# Patient Record
Sex: Female | Born: 1935 | Race: White | Hispanic: No | Marital: Married | Smoking: Never smoker
Health system: Southern US, Community
[De-identification: ages and names within clinical notes are randomized; demographics above are authoritative.]

## PROBLEM LIST (undated history)

## (undated) DIAGNOSIS — I2510 Atherosclerotic heart disease of native coronary artery without angina pectoris: Secondary | ICD-10-CM

## (undated) DIAGNOSIS — I82409 Acute embolism and thrombosis of unspecified deep veins of unspecified lower extremity: Secondary | ICD-10-CM

## (undated) DIAGNOSIS — E7800 Pure hypercholesterolemia, unspecified: Secondary | ICD-10-CM

## (undated) DIAGNOSIS — C50919 Malignant neoplasm of unspecified site of unspecified female breast: Secondary | ICD-10-CM

## (undated) DIAGNOSIS — M1990 Unspecified osteoarthritis, unspecified site: Secondary | ICD-10-CM

## (undated) DIAGNOSIS — C55 Malignant neoplasm of uterus, part unspecified: Secondary | ICD-10-CM

## (undated) DIAGNOSIS — I10 Essential (primary) hypertension: Secondary | ICD-10-CM

## (undated) DIAGNOSIS — M3500 Sicca syndrome, unspecified: Secondary | ICD-10-CM

## (undated) DIAGNOSIS — R002 Palpitations: Secondary | ICD-10-CM

## (undated) DIAGNOSIS — E669 Obesity, unspecified: Secondary | ICD-10-CM

## (undated) DIAGNOSIS — E119 Type 2 diabetes mellitus without complications: Secondary | ICD-10-CM

## (undated) DIAGNOSIS — I4891 Unspecified atrial fibrillation: Secondary | ICD-10-CM

## (undated) HISTORY — PX: MASTECTOMY, RADICAL: SHX710

## (undated) HISTORY — DX: Acute embolism and thrombosis of unspecified deep veins of unspecified lower extremity: I82.409

## (undated) HISTORY — PX: TOTAL HIP ARTHROPLASTY: SHX124

## (undated) HISTORY — DX: Malignant neoplasm of unspecified site of unspecified female breast: C50.919

## (undated) HISTORY — PX: CARDIAC SURGERY: SHX584

## (undated) HISTORY — PX: ABDOMINAL HYSTERECTOMY: SHX81

## (undated) HISTORY — DX: Unspecified atrial fibrillation: I48.91

## (undated) HISTORY — PX: APPENDECTOMY: SHX54

---

## 2005-07-12 HISTORY — PX: TOTAL HIP ARTHROPLASTY: SHX124

## 2008-12-23 ENCOUNTER — Ambulatory Visit: Payer: Self-pay | Admitting: Internal Medicine

## 2010-06-16 ENCOUNTER — Ambulatory Visit: Payer: Self-pay | Admitting: Family Medicine

## 2010-07-12 ENCOUNTER — Ambulatory Visit: Payer: Self-pay | Admitting: Family Medicine

## 2010-08-12 ENCOUNTER — Ambulatory Visit: Payer: Self-pay | Admitting: Family Medicine

## 2015-11-25 ENCOUNTER — Encounter: Payer: Self-pay | Admitting: Emergency Medicine

## 2015-11-25 ENCOUNTER — Ambulatory Visit
Admission: EM | Admit: 2015-11-25 | Discharge: 2015-11-25 | Disposition: A | Discharge status: Home / Self Care | Payer: Medicare Other | Attending: Family Medicine | Admitting: Family Medicine

## 2015-11-25 DIAGNOSIS — H9192 Unspecified hearing loss, left ear: Secondary | ICD-10-CM

## 2015-11-25 HISTORY — DX: Type 2 diabetes mellitus without complications: E11.9

## 2015-11-25 HISTORY — DX: Malignant neoplasm of uterus, part unspecified: C55

## 2015-11-25 HISTORY — DX: Palpitations: R00.2

## 2015-11-25 HISTORY — DX: Unspecified osteoarthritis, unspecified site: M19.90

## 2015-11-25 HISTORY — DX: Pure hypercholesterolemia, unspecified: E78.00

## 2015-11-25 HISTORY — DX: Atherosclerotic heart disease of native coronary artery without angina pectoris: I25.10

## 2015-11-25 HISTORY — DX: Sjogren syndrome, unspecified: M35.00

## 2015-11-25 HISTORY — DX: Obesity, unspecified: E66.9

## 2015-11-25 HISTORY — DX: Essential (primary) hypertension: I10

## 2015-11-25 NOTE — ED Provider Notes (Signed)
CSN: [id number]     Arrival date & time 11/25/15  1336 History   First MD Initiated Contact with Patient 11/25/15 1528     Chief Complaint  Patient presents with  . Otalgia   (Consider location/radiation/quality/duration/timing/severity/associated sxs/prior Treatment) HPI  82 year old female who presents with the sudden onset of hearing loss and a in the left ear. She states that around 11:30 this morning it happened suddenly. The outside of her ear and it seemed to pop a few times in her hearing came back but then was eventually lost again. Noticed some swelling around her ear and her jaw. She's not had any altitude episodes either an airplane [place]. He states has noticed no foreign objects.      Past Medical History  Diagnosis Date  . Hypertension   . Diabetes mellitus without complication ([place])   . Coronary artery disease   . Obesity   . Hypercholesteremia   . Arthritis   . Palpitation   . Uterine cancer ([place])   . Sicca syndrome [facility])    Past Surgical History  Procedure Laterality Date  . Abdominal hysterectomy    . Appendectomy    . Cardiac surgery     No family history on file. Social History  Substance Use Topics  . Smoking status: Not on file  . Smokeless tobacco: Not on file  . Alcohol Use: Not on file   OB History    No data available     Review of Systems  Constitutional: Negative for fever, chills, activity change and fatigue.  HENT: Positive for ear pain and hearing loss.   All other systems reviewed and are negative.   Allergies  Acetaminophen; Amlodipine; Diltiazem; Lisinopril; Penicillins; Sulfa antibiotics; and Hydralazine  Home Medications   Prior to Admission medications   Medication Sig Start Date End Date Taking? Authorizing Provider  aspirin 81 MG tablet Take 81 mg by mouth daily.   Yes Historical Provider, MD  atorvastatin (LIPITOR) 40 MG tablet Take 40 mg by mouth daily.   Yes Historical Provider, MD  carvedilol  (COREG) 25 MG tablet Take 25 mg by mouth 2 (two) times daily with a meal.   Yes Historical Provider, MD  Cholecalciferol (VITAMIN D-3) 1000 units CAPS Take 1 capsule by mouth daily.   Yes Historical Provider, MD  docusate sodium (COLACE) 100 MG capsule Take 100 mg by mouth 2 (two) times daily.   Yes Historical Provider, MD  furosemide (LASIX) 20 MG tablet Take 20 mg by mouth daily.   Yes Historical Provider, MD  insulin glargine (LANTUS) 100 UNIT/ML injection Inject 48 Units into the skin at bedtime.   Yes Historical Provider, MD  insulin lispro (HUMALOG) 100 UNIT/ML injection Inject 15 Units into the skin 3 (three) times daily with meals.   Yes Historical Provider, MD  losartan (COZAAR) 100 MG tablet Take 100 mg by mouth daily.   Yes Historical Provider, MD  meloxicam (MOBIC) 7.5 MG tablet Take 7.5 mg by mouth daily.   Yes Historical Provider, MD  Multiple Vitamin (MULTIVITAMIN) tablet Take 1 tablet by mouth daily.   Yes Historical Provider, MD  nitroGLYCERIN (NITROSTAT) 0.4 MG SL tablet Place 0.4 mg under the tongue every 5 (five) minutes as needed for chest pain.   Yes Historical Provider, MD  nystatin (NYSTATIN) powder Apply topically 2 (two) times daily.   Yes Historical Provider, MD  polyethylene glycol (MIRALAX / GLYCOLAX) packet Take 17 g by mouth daily as needed.  Yes Historical Provider, MD  spironolactone (ALDACTONE) 25 MG tablet Take 25 mg by mouth daily.   Yes Historical Provider, MD  traMADol (ULTRAM) 50 MG tablet Take 50 mg by mouth every 6 (six) hours as needed.   Yes Historical Provider, MD  vitamin B-12 (CYANOCOBALAMIN) 1000 MCG tablet Take 1,000 mcg by mouth daily.   Yes Historical Provider, MD   Meds Ordered and Administered this Visit  Medications - No data to display  BP 190/76 mmHg  Pulse 60  Temp(Src) 98 F (36.7 C) (Oral)  Resp 16  Ht 5\' 2"  (1.575 m)  Wt 228 lb (103.42 kg)  BMI 41.69 kg/m2  SpO2 99% No data found.   Physical Exam  Constitutional: She appears  well-developed and well-nourished. No distress.  HENT:  Head: Normocephalic and atraumatic.  Right Ear: External ear normal.  Nose: Nose normal.  Mouth/Throat: Oropharynx is clear and moist.  Examination of left ear shows a large smooth poorly object at the depth of the ear. There are no anatomical landmarks noticed. There is no tenderness to traction of the tragus or article. No discharge is seen the canal is not swollen.  Skin: She is not diaphoretic.  Nursing note and vitals reviewed.   ED Course  Procedures (including critical care time)  Labs Review Labs Reviewed - No data to display  Imaging Review No results found.   Visual Acuity Review  Right Eye Distance:   Left Eye Distance:   Bilateral Distance:    Right Eye Near:   Left Eye Near:    Bilateral Near:         MDM   1. Hearing loss, left   Dr Alveta Heimlich examined the patient as well. We have recommended the patient be seen by ENT for further evaluation and treatment. Arrangements were made with [facility] for the patient to be seen tomorrow however she is unable to keep the appointment because she has noted a commitment. I left a message on answering machine to contact her so they can arrange appointment more suitable to her schedule.    Lorin Picket, PA-C 11/25/15 1640

## 2015-11-25 NOTE — Discharge Instructions (Signed)
Hearing Loss °Hearing loss is a partial or total loss of the ability to hear. This can be temporary or permanent, and it can happen in one or both ears. Hearing loss may be referred to as deafness. °Medical care is necessary to treat hearing loss properly and to prevent the condition from getting worse. Your hearing may partially or completely come back, depending on what caused your hearing loss and how severe it is. In some cases, hearing loss is permanent. °CAUSES °Common causes of hearing loss include:  °· Too much wax in the ear canal.   °· Infection of the ear canal or middle ear.   °· Fluid in the middle ear.   °· Injury to the ear or surrounding area.   °· An object stuck in the ear.   °· Prolonged exposure to loud sounds, such as music.   °Less common causes of hearing loss include:  °· Tumors in the ear.   °· Viral or bacterial infections, such as meningitis.   °· A hole in the eardrum (perforated eardrum). °· Problems with the hearing nerve that sends signals between the brain and the ear. °· Certain medicines.   °SYMPTOMS  °Symptoms of this condition may include: °· Difficulty telling the difference between sounds. °· Difficulty following a conversation when there is background noise. °· Lack of response to sounds in your environment. This may be most noticeable when you do not respond to startling sounds. °· Needing to turn up the volume on the television, radio, etc. °· Ringing in the ears. °· Dizziness. °· Pain in the ears. °DIAGNOSIS °This condition is diagnosed based on a physical exam and a hearing test (audiometry). The audiometry test will be performed by a hearing specialist (audiologist). You may also be referred to an ear, nose, and throat (ENT) specialist (otolaryngologist).  °TREATMENT °Treatment for recent onset of hearing loss may include:  °· Ear wax removal.   °· Being prescribed medicines to prevent infection (antibiotics).   °· Being prescribed medicines to reduce inflammation  (corticosteroids).   °HOME CARE INSTRUCTIONS °· If you were prescribed an antibiotic medicine, take it as told by your health care provider. Do not stop taking the antibiotic even if you start to feel better. °· Take over-the-counter and prescription medicines only as told by your health care provider. °· Avoid loud noises.   °· Return to your normal activities as told by your health care provider. Ask your health care provider what activities are safe for you. °· Keep all follow-up visits as told by your health care provider. This is important. °SEEK MEDICAL CARE IF:  °· You feel dizzy.   °· You develop new symptoms.   °· You vomit or feel nauseous.   °· You have a fever.   °SEEK IMMEDIATE MEDICAL CARE IF: °· You develop sudden changes in your vision.   °· You have severe ear pain.   °· You have new or increased weakness. °· You have a severe headache. °  °This information is not intended to replace advice given to you by your health care provider. Make sure you discuss any questions you have with your health care provider. °  °Document Released: 06/28/2005 Document Revised: 03/19/2015 Document Reviewed: 11/13/2014 °Elsevier Interactive Patient Education ©2016 [employer] ° °

## 2015-11-25 NOTE — ED Notes (Signed)
Patient c/o left ear pain and swelling x today.

## 2016-03-18 ENCOUNTER — Encounter: Payer: Self-pay | Admitting: Emergency Medicine

## 2016-03-18 ENCOUNTER — Emergency Department
Admission: EM | Admit: 2016-03-18 | Discharge: 2016-03-19 | Disposition: A | Discharge status: Home / Self Care | Payer: Medicare Other | Attending: Emergency Medicine | Admitting: Emergency Medicine

## 2016-03-18 DIAGNOSIS — E11649 Type 2 diabetes mellitus with hypoglycemia without coma: Secondary | ICD-10-CM | POA: Insufficient documentation

## 2016-03-18 DIAGNOSIS — I2510 Atherosclerotic heart disease of native coronary artery without angina pectoris: Secondary | ICD-10-CM | POA: Diagnosis not present

## 2016-03-18 DIAGNOSIS — Z7982 Long term (current) use of aspirin: Secondary | ICD-10-CM | POA: Diagnosis not present

## 2016-03-18 DIAGNOSIS — T50901A Poisoning by unspecified drugs, medicaments and biological substances, accidental (unintentional), initial encounter: Secondary | ICD-10-CM

## 2016-03-18 DIAGNOSIS — E162 Hypoglycemia, unspecified: Secondary | ICD-10-CM

## 2016-03-18 DIAGNOSIS — I10 Essential (primary) hypertension: Secondary | ICD-10-CM | POA: Diagnosis not present

## 2016-03-18 DIAGNOSIS — Z794 Long term (current) use of insulin: Secondary | ICD-10-CM | POA: Insufficient documentation

## 2016-03-18 DIAGNOSIS — T383X1A Poisoning by insulin and oral hypoglycemic [antidiabetic] drugs, accidental (unintentional), initial encounter: Secondary | ICD-10-CM | POA: Insufficient documentation

## 2016-03-18 LAB — GLUCOSE, CAPILLARY: Glucose-Capillary: 79 mg/dL (ref 65–99)

## 2016-03-18 NOTE — ED Provider Notes (Signed)
[facility] Emergency Department Provider Note   ____________________________________________   First MD Initiated Contact with Patient 03/18/16 2334     (approximate)  I have reviewed the triage vital signs and the nursing notes.   HISTORY  Chief Complaint Hypoglycemia   HPI Molly Mercado is a 82 y.o. female who presents to the ED from home via EMS with a chief complaint of accidental overdose. Patient is insulin-dependent diabetic and accidentally took 42 units of Humalog (instead of Lantus) at approximately 10:30 PM. She is asymptomatic but was afraid that her sugars would drop. States she has had multiple stressors recently which has made her tired; husband is hospitalized and scheduled for cardiac surgery next week. 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 Patient reports she just mixed up her insulins; denies active harm or intentional overdose. Patient drank orange choose prior to EMS arrival. Her blood sugars were in the 170s and 180s en route.Denies recent fever, chills, chest pain, shortness of breath, abdominal pain, nausea, vomiting, dysuria, diarrhea. Denies recent travel or trauma. Nothing makes her symptoms better or worse.   Past Medical History:  Diagnosis Date  . Arthritis   . Coronary artery disease   . Diabetes mellitus without complication ([place])   . Hypercholesteremia   . Hypertension   . Obesity   . Palpitation   . Sicca syndrome ([place])   . Uterine cancer ([place])     There are no active problems to display for this patient.   Past Surgical History:  Procedure Laterality Date  . ABDOMINAL HYSTERECTOMY    . APPENDECTOMY    . CARDIAC SURGERY      Prior to Admission medications   Medication Sig Start Date End Date Taking? Authorizing Provider  aspirin 81 MG tablet Take 81 mg by mouth daily.    Historical Provider, MD  atorvastatin (LIPITOR) 40 MG tablet Take 40 mg by mouth daily.     Historical Provider, MD  carvedilol (COREG) 25 MG tablet Take 25 mg by mouth 2 (two) times daily with a meal.    Historical Provider, MD  Cholecalciferol (VITAMIN D-3) 1000 units CAPS Take 1 capsule by mouth daily.    Historical Provider, MD  docusate sodium (COLACE) 100 MG capsule Take 100 mg by mouth 2 (two) times daily.    Historical Provider, MD  furosemide (LASIX) 20 MG tablet Take 20 mg by mouth daily.    Historical Provider, MD  insulin glargine (LANTUS) 100 UNIT/ML injection Inject 48 Units into the skin at bedtime.    Historical Provider, MD  insulin lispro (HUMALOG) 100 UNIT/ML injection Inject 15 Units into the skin 3 (three) times daily with meals.    Historical Provider, MD  losartan (COZAAR) 100 MG tablet Take 100 mg by mouth daily.    Historical Provider, MD  meloxicam (MOBIC) 7.5 MG tablet Take 7.5 mg by mouth daily.    Historical Provider, MD  Multiple Vitamin (MULTIVITAMIN) tablet Take 1 tablet by mouth daily.    Historical Provider, MD  nitroGLYCERIN (NITROSTAT) 0.4 MG SL tablet Place 0.4 mg under the tongue every 5 (five) minutes as needed for chest pain.    Historical Provider, MD  nystatin (NYSTATIN) powder Apply topically 2 (two) times daily.    Historical Provider, MD  polyethylene glycol (MIRALAX / GLYCOLAX) packet Take 17 g by mouth daily as needed.    Historical Provider, MD  spironolactone (ALDACTONE) 25 MG tablet Take  25 mg by mouth daily.    Historical Provider, MD  traMADol (ULTRAM) 50 MG tablet Take 50 mg by mouth every 6 (six) hours as needed.    Historical Provider, MD  vitamin B-12 (CYANOCOBALAMIN) 1000 MCG tablet Take 1,000 mcg by mouth daily.    Historical Provider, MD    Allergies Acetaminophen; Amlodipine; Diltiazem; Lisinopril; Penicillins; Sulfa antibiotics; and Hydralazine  History reviewed. No pertinent family history.  Social History Social History  Substance Use Topics  . Smoking status: Not on file  . Smokeless tobacco: Not on file  . Alcohol  use Not on file  Nonsmoker  Review of Systems  Constitutional: Positive for fatigue. No fever/chills. Eyes: No visual changes. ENT: No sore throat. Cardiovascular: Denies chest pain. Respiratory: Denies shortness of breath. Gastrointestinal: No abdominal pain.  No nausea, no vomiting.  No diarrhea.  No constipation. Genitourinary: Negative for dysuria. Musculoskeletal: Negative for back pain. Skin: Negative for rash. Neurological: Negative for headaches, focal weakness or numbness.  10-point ROS otherwise negative.  ____________________________________________   PHYSICAL EXAM:  VITAL SIGNS: ED Triage Vitals [03/18/16 2334]  Enc Vitals Group     BP      Pulse      Resp      Temp      Temp src      SpO2 97 %     Weight      Height      Head Circumference      Peak Flow      Pain Score      Pain Loc      Pain Edu?      Excl. in [place]?     Constitutional: Alert and oriented. Well appearing and in no acute distress. Eyes: Conjunctivae are normal. PERRL. EOMI. Head: Atraumatic. Nose: No congestion/rhinnorhea. Mouth/Throat: Mucous membranes are moist.  Oropharynx non-erythematous. Neck: No stridor.   Cardiovascular: Normal rate, regular rhythm. Grossly normal heart sounds.  Good peripheral circulation. Respiratory: Normal respiratory effort.  No retractions. Lungs CTAB. Gastrointestinal: Soft and nontender. No distention. No abdominal bruits. No CVA tenderness. Musculoskeletal: No lower extremity tenderness nor edema.  No joint effusions. Neurologic:  Normal speech and language. No gross focal neurologic deficits are appreciated. No gait instability. Skin:  Skin is warm, dry and intact. No rash noted. Psychiatric: Mood and affect are normal. Speech and behavior are normal.  ____________________________________________   LABS (all labs ordered are listed, but only abnormal results are displayed)  Labs Reviewed  COMPREHENSIVE METABOLIC PANEL - Abnormal; Notable for  the following:       Result Value   Potassium 3.4 (*)    GFR calc non Af Amer 55 (*)    All other components within normal limits  URINALYSIS COMPLETEWITH MICROSCOPIC ([facility] ONLY) - Abnormal; Notable for the following:    Color, Urine STRAW (*)    APPearance CLEAR (*)    Bacteria, UA RARE (*)    Squamous Epithelial / LPF 0-5 (*)    All other components within normal limits  GLUCOSE, CAPILLARY - Abnormal; Notable for the following:    Glucose-Capillary 55 (*)    All other components within normal limits  GLUCOSE, CAPILLARY - Abnormal; Notable for the following:    Glucose-Capillary 104 (*)    All other components within normal limits  GLUCOSE, CAPILLARY - Abnormal; Notable for the following:    Glucose-Capillary 121 (*)    All other components within normal limits  GLUCOSE, CAPILLARY - Abnormal; Notable for the following:  Glucose-Capillary 115 (*)    All other components within normal limits  CBC WITH DIFFERENTIAL/PLATELET  TROPONIN I  GLUCOSE, CAPILLARY  GLUCOSE, CAPILLARY   ____________________________________________  EKG  ED ECG REPORT I, Johnnae Impastato J, the attending physician, personally viewed and interpreted this ECG.   Date: 03/19/2016  EKG Time: 0009  Rate: 82  Rhythm: atrial fibrillation, rate 82  Axis: Within normal limits  Intervals:nonspecific intraventricular conduction delay  ST&T Change: Nonspecific  ____________________________________________  RADIOLOGY  None ____________________________________________   PROCEDURES  Procedure(s) performed: None  Procedures  Critical Care performed: No  ____________________________________________   INITIAL IMPRESSION / ASSESSMENT AND PLAN / ED COURSE  Pertinent labs & imaging results that were available during my care of the patient were reviewed by me and considered in my medical decision making (see chart for details).  82 year old female, insulin-dependent diabetic who accidentally administered    42 units of Humalog at 10:30 PM. FSBS on arrival is now 22; will have patient eat and drink. Will obtain basic lab work, urinalysis and observe patient in the emergency department.  Clinical Course  Comment By Time  Patient's last FSBS = 55. She is currently eating. She should be at the peak onset of activity of Humalog. Will continue to monitor. If patient continues to be hypoglycemic, will admit to the hospital on D10 drip. Paulette Blanch, MD 09/08 0215  Patient's blood sugars over the past 3 hours have been over 100. Last FSBS 115. She feels fine and is eager for discharge. I have asked her to hold her morning dose of Humalog and she is to check her sugars closely today. Strict return precautions given. Patient verbalizes understanding and agrees with plan of care. Paulette Blanch, MD 09/08 0540     ____________________________________________   FINAL CLINICAL IMPRESSION(S) / ED DIAGNOSES  Final diagnoses:  Accidental overdose, initial encounter  Hypoglycemia      NEW MEDICATIONS STARTED DURING THIS VISIT:  Discharge Medication List as of 03/19/2016  5:43 AM       Note:  This document was prepared using Dragon voice recognition software and may include unintentional dictation errors.    Paulette Blanch, MD 03/19/16 [phone]

## 2016-03-18 NOTE — ED Triage Notes (Signed)
Pt was taking her medicine this evening. Pt accidentally took 42 units of her Humalog and was worried her sugars would drop. Pt stating she was tired because she was helping her husband at the hospital today and took the Humalog instead of her Lantus. Pt stating that she took in around 10:30pm. Pt called 911 when she realized. Pt was given orange juice in route. Pt's BG were 187, 171, 177, and 182 on her trip her.

## 2016-03-19 LAB — URINALYSIS COMPLETE WITH MICROSCOPIC (ARMC ONLY)
Bilirubin Urine: NEGATIVE
Glucose, UA: NEGATIVE mg/dL
HGB URINE DIPSTICK: NEGATIVE
Ketones, ur: NEGATIVE mg/dL
LEUKOCYTES UA: NEGATIVE
Nitrite: NEGATIVE
PH: 5 (ref 5.0–8.0)
Protein, ur: NEGATIVE mg/dL
RBC / HPF: NONE SEEN RBC/hpf (ref 0–5)
Specific Gravity, Urine: 1.006 (ref 1.005–1.030)

## 2016-03-19 LAB — GLUCOSE, CAPILLARY
GLUCOSE-CAPILLARY: 115 mg/dL — AB (ref 65–99)
GLUCOSE-CAPILLARY: 55 mg/dL — AB (ref 65–99)
GLUCOSE-CAPILLARY: 97 mg/dL (ref 65–99)
Glucose-Capillary: 104 mg/dL — ABNORMAL HIGH (ref 65–99)
Glucose-Capillary: 121 mg/dL — ABNORMAL HIGH (ref 65–99)

## 2016-03-19 LAB — CBC WITH DIFFERENTIAL/PLATELET
BASOS ABS: 0 10*3/uL (ref 0–0.1)
BASOS PCT: 0 %
EOS PCT: 1 %
Eosinophils Absolute: 0.1 10*3/uL (ref 0–0.7)
HEMATOCRIT: 37.5 % (ref 35.0–47.0)
Hemoglobin: 13.1 g/dL (ref 12.0–16.0)
Lymphocytes Relative: 31 %
Lymphs Abs: 2.5 10*3/uL (ref 1.0–3.6)
MCH: 28.7 pg (ref 26.0–34.0)
MCHC: 35 g/dL (ref 32.0–36.0)
MCV: 82 fL (ref 80.0–100.0)
MONO ABS: 0.7 10*3/uL (ref 0.2–0.9)
Monocytes Relative: 8 %
NEUTROS ABS: 4.7 10*3/uL (ref 1.4–6.5)
Neutrophils Relative %: 60 %
PLATELETS: 217 10*3/uL (ref 150–440)
RBC: 4.57 MIL/uL (ref 3.80–5.20)
RDW: 14 % (ref 11.5–14.5)
WBC: 7.9 10*3/uL (ref 3.6–11.0)

## 2016-03-19 LAB — COMPREHENSIVE METABOLIC PANEL
ALBUMIN: 4 g/dL (ref 3.5–5.0)
ALT: 19 U/L (ref 14–54)
AST: 21 U/L (ref 15–41)
Alkaline Phosphatase: 58 U/L (ref 38–126)
Anion gap: 8 (ref 5–15)
BUN: 18 mg/dL (ref 6–20)
CHLORIDE: 106 mmol/L (ref 101–111)
CO2: 26 mmol/L (ref 22–32)
Calcium: 9.9 mg/dL (ref 8.9–10.3)
Creatinine, Ser: 0.93 mg/dL (ref 0.44–1.00)
GFR calc Af Amer: >60 mL/min (ref >60)
GFR calc non Af Amer: 55 mL/min — ABNORMAL LOW (ref >60)
GLUCOSE: 71 mg/dL (ref 65–99)
POTASSIUM: 3.4 mmol/L — AB (ref 3.5–5.1)
Sodium: 140 mmol/L (ref 135–145)
Total Bilirubin: 0.7 mg/dL (ref 0.3–1.2)
Total Protein: 6.9 g/dL (ref 6.5–8.1)

## 2016-03-19 LAB — TROPONIN I: Troponin I: <0.03 ng/mL (ref <0.03)

## 2016-03-19 NOTE — ED Notes (Signed)
Pt was sleeping on nurse's arrival to room. Pt BG evaluated. BG WNL and Dr. Beather Arbour notified.

## 2016-03-19 NOTE — ED Notes (Signed)
Dr. Beather Arbour aware of pt's BG.

## 2016-03-19 NOTE — ED Notes (Signed)
Spoke with pt's neighbor Consuello Closs. Informed neighbor that pt was ready to be discharged in the next little while. Pt is awake at this time and was given some water by another nurse. Pt is in NAD at this time.

## 2016-03-19 NOTE — ED Notes (Signed)
Dr. Beather Arbour notified of pt's Bg. Pt given orange juice, ice cream, and peanut butter crackers. Pt is alert and eating the snack at this time. Pt was helped to the restroom with nurse assist.

## 2016-03-19 NOTE — Discharge Instructions (Signed)
1. Please hold her morning dose of Humalog. 2. Check your blood sugars frequently today. 3. Return to the ER for recurrent or worsening symptoms, persistent vomiting, difficulty breathing or other concerns.

## 2016-03-19 NOTE — ED Notes (Signed)
Dorian Pod is on her way to pick up pt.

## 2016-03-19 NOTE — ED Notes (Addendum)
Pt is currently eating peanut butter and graham crackers per her request. Pt's BG was last 79 and pt was wanting to eat to keep them from dropping further. Dr. Beather Arbour is aware of BG and was ok with food. Pt was not wanting a sandwich. Pt also did drink 4oz of apple juice after BG was taken. Pt placed on monitor. Pt was c/o some mild nausea on arrival to Ed. Raquel, RN found pt a recliner to sit in. Pt was stating that she is much more comfortable with sitting that laying in the stretcher.

## 2017-02-01 ENCOUNTER — Ambulatory Visit
Admission: EM | Admit: 2017-02-01 | Discharge: 2017-02-01 | Disposition: A | Discharge status: Home / Self Care | Payer: Medicare Other | Attending: Family Medicine | Admitting: Family Medicine

## 2017-02-01 DIAGNOSIS — B3731 Acute candidiasis of vulva and vagina: Secondary | ICD-10-CM

## 2017-02-01 DIAGNOSIS — B356 Tinea cruris: Secondary | ICD-10-CM | POA: Diagnosis not present

## 2017-02-01 DIAGNOSIS — N898 Other specified noninflammatory disorders of vagina: Secondary | ICD-10-CM

## 2017-02-01 DIAGNOSIS — B373 Candidiasis of vulva and vagina: Secondary | ICD-10-CM | POA: Diagnosis not present

## 2017-02-01 MED ORDER — NAFTIFINE HCL 2 % EX GEL: 1.0000 "application " | Freq: Once | CUTANEOUS | 1 refills | Status: AC

## 2017-02-01 MED ORDER — KETOCONAZOLE 2 % EX CREA: 1.0000 "application " | TOPICAL_CREAM | Freq: Two times a day (BID) | CUTANEOUS | 1 refills | Status: AC

## 2017-02-01 MED ORDER — FLUCONAZOLE 150 MG PO TABS: 150.0000 mg | ORAL_TABLET | Freq: Once | ORAL | 0 refills | Status: AC

## 2017-02-01 NOTE — ED Provider Notes (Signed)
[facility]    CSN: [id number] Arrival date & time: 02/01/17  0848     History   Chief Complaint Chief Complaint  Patient presents with  . Vaginal Itching    HPI Molly Mercado is a 83 y.o. female.   Patient's here because of yeast infection in the groin area thighs and vaginal area. She states for about 3 weeks having burning and vaginal area thighs and groin area as well. She states that she's been using nystatin another over-the-counter medication but just hasn't done the trick. In fact things got worse she is losing the battle. She's had this happen to her before she also has urine incontinence and states that she thinks is from some dripping and leaking. She has history of diabetes hyperlipidemia hypertension obesity urine cancer and urine incontinence. She is planned have hip surgery done in the near future no pertinent family medical history. She is allergic to Tylenol Cardizem Norvasc lisinopril penicillin and sulfur and Vistaril.   The history is provided by the patient. No language interpreter was used.  Vaginal Itching  The current episode started more than 1 week ago. The problem occurs constantly. The problem has been gradually worsening. Pertinent negatives include no chest pain, no abdominal pain, no headaches and no shortness of breath. Nothing aggravates the symptoms. Nothing relieves the symptoms.    Past Medical History:  Diagnosis Date  . Arthritis   . Coronary artery disease   . Diabetes mellitus without complication ([place])   . Hypercholesteremia   . Hypertension   . Obesity   . Palpitation   . Sicca syndrome ([place])   . Uterine cancer ([place])     There are no active problems to display for this patient.   Past Surgical History:  Procedure Laterality Date  . ABDOMINAL HYSTERECTOMY    . APPENDECTOMY    . CARDIAC SURGERY      OB History    No data available       Home Medications    Prior to Admission medications   Medication Sig  Start Date End Date Taking? Authorizing Provider  aspirin 81 MG tablet Take 81 mg by mouth daily.   Yes [provider]  atorvastatin (LIPITOR) 40 MG tablet Take 40 mg by mouth daily.   Yes [provider]  carvedilol (COREG) 25 MG tablet Take 25 mg by mouth 2 (two) times daily with a meal.   Yes [provider]  Cholecalciferol (VITAMIN D-3) 1000 units CAPS Take 1 capsule by mouth daily.   Yes [provider]  docusate sodium (COLACE) 100 MG capsule Take 100 mg by mouth 2 (two) times daily.   Yes [provider]  furosemide (LASIX) 20 MG tablet Take 20 mg by mouth daily.   Yes [provider]  ibuprofen (ADVIL,MOTRIN) 200 MG tablet Take 400 mg by mouth every 6 (six) hours as needed for moderate pain.   Yes [provider]  insulin glargine (LANTUS) 100 UNIT/ML injection Inject 48 Units into the skin at bedtime.   Yes [provider]  insulin lispro (HUMALOG) 100 UNIT/ML injection Inject 15 Units into the skin 3 (three) times daily with meals.   Yes [provider]  losartan (COZAAR) 100 MG tablet Take 100 mg by mouth daily.   Yes [provider]  meloxicam (MOBIC) 7.5 MG tablet Take 7.5 mg by mouth daily.   Yes [provider]  Multiple Vitamin (MULTIVITAMIN) tablet Take 1 tablet by mouth daily.  Yes [provider]  nitroGLYCERIN (NITROSTAT) 0.4 MG SL tablet Place 0.4 mg under the tongue every 5 (five) minutes as needed for chest pain.   Yes [provider]  nystatin (NYSTATIN) powder Apply topically 2 (two) times daily.   Yes [provider]  polyethylene glycol (MIRALAX / GLYCOLAX) packet Take 17 g by mouth daily as needed.   Yes [provider]  spironolactone (ALDACTONE) 25 MG tablet Take 25 mg by mouth daily.   Yes [provider]  vitamin B-12 (CYANOCOBALAMIN) 1000 MCG tablet Take 1,000 mcg by mouth daily.   Yes [provider]    fluconazole (DIFLUCAN) 150 MG tablet Take 1 tablet (150 mg total) by mouth once. Repeat in one week 02/01/17 02/01/17  Frederich Cha, MD  ketoconazole (NIZORAL) 2 % cream Apply 1 application topically 2 (two) times daily. 02/01/17   Frederich Cha, MD  Naftifine HCl (NAFTIN) 2 % GEL Apply 1 application topically once. To moist area 02/01/17 02/01/17  Frederich Cha, MD    Family History History reviewed. No pertinent family history.  Social History Social History  Substance Use Topics  . Smoking status: Never Smoker  . Smokeless tobacco: Never Used  . Alcohol use No     Allergies   Acetaminophen; Amlodipine; Diltiazem; Lisinopril; Penicillins; Sulfa antibiotics; and Hydralazine   Review of Systems Review of Systems  Respiratory: Negative for shortness of breath.   Cardiovascular: Negative for chest pain.  Gastrointestinal: Negative for abdominal pain.  Genitourinary: Positive for vaginal discharge.  Neurological: Negative for headaches.  All other systems reviewed and are negative.    Physical Exam Triage Vital Signs ED Triage Vitals  Enc Vitals Group     BP 02/01/17 0918 (!) 140/55     Pulse Rate 02/01/17 0918 75     Resp 02/01/17 0918 16     Temp 02/01/17 0918 98.2 F (36.8 C)     Temp Source 02/01/17 0918 Oral     SpO2 02/01/17 0918 97 %     Weight 02/01/17 0921 212 lb (96.2 kg)     Height 02/01/17 0921 5\' 2"  (1.575 m)     Head Circumference --      Peak Flow --      Pain Score --      Pain Loc --      Pain Edu? --      Excl. in [place]? --    No data found.   Updated Vital Signs BP (!) 140/55 (BP Location: Left Arm)   Pulse 75   Temp 98.2 F (36.8 C) (Oral)   Resp 16   Ht 5\' 2"  (1.575 m)   Wt 212 lb (96.2 kg)   SpO2 97%   BMI 38.78 kg/m   Visual Acuity Right Eye Distance:   Left Eye Distance:   Bilateral Distance:    Right Eye Near:   Left Eye Near:    Bilateral Near:     Physical Exam  Constitutional: She is oriented to person, place, and time. She  appears well-developed and well-nourished.  HENT:  Head: Normocephalic and atraumatic.  Right Ear: External ear normal.  Left Ear: External ear normal.  Mouth/Throat: Oropharynx is clear and moist.  Eyes: Pupils are equal, round, and reactive to light.  Neck: Normal range of motion.  Pulmonary/Chest: Effort normal.  Genitourinary: Vagina normal. There is rash on the right labia. There is rash on the left labia.  Musculoskeletal: Normal range of motion.  Neurological: She is alert  and oriented to person, place, and time.  Skin: Rash noted. There is erythema.  Patient Santiago Glad dizziness yeast infection involving the outer vaginal area the graft the folds of the thighs of upper thighs and lower abdomen and perineum as well.  Psychiatric: She has a normal mood and affect.  Vitals reviewed.    UC Treatments / Results  Labs (all labs ordered are listed, but only abnormal results are displayed) Labs Reviewed - No data to display  EKG  EKG Interpretation None       Radiology No results found.  Procedures Procedures (including critical care time)  Medications Ordered in UC Medications - No data to display   Initial Impression / Assessment and Plan / UC Course  I have reviewed the triage vital signs and the nursing notes.  Pertinent labs & imaging results that were available during my care of the patient were reviewed by me and considered in my medical decision making (see chart for details).      Patient patient will be placed on Naftin gel for the groin area for the creases Nizoral cream with outside areas and dry am also place her on Diflucan tablet take 1 now and repeat in a week as needed follow-up PCP of choice in 3 weeks   Final Clinical Impressions(s) / UC Diagnoses   Final diagnoses:  Yeast infection involving the vagina and surrounding area  Tinea cruris    New Prescriptions New Prescriptions   FLUCONAZOLE (DIFLUCAN) 150 MG TABLET    Take 1 tablet (150 mg  total) by mouth once. Repeat in one week   KETOCONAZOLE (NIZORAL) 2 % CREAM    Apply 1 application topically 2 (two) times daily.   NAFTIFINE HCL (NAFTIN) 2 % GEL    Apply 1 application topically once. To moist area     Note: This dictation was prepared with Dragon dictation along with smaller phrase technology. Any transcriptional errors that result from this process are unintentional.   Frederich Cha, MD 02/01/17 1016

## 2017-02-01 NOTE — ED Triage Notes (Signed)
83 year old Caucasian woman is here today with complaints of vaginal itching and redness that started about three weeks ago. She states she has been using OTC Lotrimin and Rx Nystatin with no relief. She states the skin in her  left groin area has broken and has bled some.

## 2018-01-05 ENCOUNTER — Other Ambulatory Visit: Payer: Self-pay | Admitting: Family Medicine

## 2018-01-05 DIAGNOSIS — M79605 Pain in left leg: Secondary | ICD-10-CM

## 2018-01-06 ENCOUNTER — Other Ambulatory Visit: Payer: Self-pay | Admitting: Family Medicine

## 2018-01-06 ENCOUNTER — Ambulatory Visit
Admission: RE | Admit: 2018-01-06 | Discharge: 2018-01-06 | Disposition: A | Discharge status: Home / Self Care | Payer: Medicare Other | Source: Ambulatory Visit | Attending: Family Medicine | Admitting: Family Medicine

## 2018-01-06 DIAGNOSIS — R52 Pain, unspecified: Secondary | ICD-10-CM

## 2018-01-06 DIAGNOSIS — M1712 Unilateral primary osteoarthritis, left knee: Secondary | ICD-10-CM | POA: Insufficient documentation

## 2018-01-06 DIAGNOSIS — I82411 Acute embolism and thrombosis of right femoral vein: Secondary | ICD-10-CM | POA: Insufficient documentation

## 2018-01-06 DIAGNOSIS — R609 Edema, unspecified: Secondary | ICD-10-CM

## 2018-01-06 DIAGNOSIS — M79605 Pain in left leg: Secondary | ICD-10-CM | POA: Insufficient documentation

## 2018-01-06 DIAGNOSIS — M7989 Other specified soft tissue disorders: Secondary | ICD-10-CM | POA: Insufficient documentation

## 2018-01-30 ENCOUNTER — Inpatient Hospital Stay: Payer: Medicare Other | Attending: Oncology | Admitting: Oncology

## 2018-01-30 ENCOUNTER — Encounter: Payer: Self-pay | Admitting: Oncology

## 2018-01-30 VITALS — BP 116/72 | HR 62 | Temp 99.8°F | Resp 18 | Ht 62.0 in | Wt 211.6 lb

## 2018-01-30 DIAGNOSIS — Z801 Family history of malignant neoplasm of trachea, bronchus and lung: Secondary | ICD-10-CM | POA: Diagnosis not present

## 2018-01-30 DIAGNOSIS — E785 Hyperlipidemia, unspecified: Secondary | ICD-10-CM | POA: Diagnosis not present

## 2018-01-30 DIAGNOSIS — I10 Essential (primary) hypertension: Secondary | ICD-10-CM | POA: Diagnosis not present

## 2018-01-30 DIAGNOSIS — Z8542 Personal history of malignant neoplasm of other parts of uterus: Secondary | ICD-10-CM | POA: Diagnosis not present

## 2018-01-30 DIAGNOSIS — Z7901 Long term (current) use of anticoagulants: Secondary | ICD-10-CM | POA: Diagnosis not present

## 2018-01-30 DIAGNOSIS — E7800 Pure hypercholesterolemia, unspecified: Secondary | ICD-10-CM | POA: Diagnosis not present

## 2018-01-30 DIAGNOSIS — E669 Obesity, unspecified: Secondary | ICD-10-CM | POA: Diagnosis not present

## 2018-01-30 DIAGNOSIS — I2510 Atherosclerotic heart disease of native coronary artery without angina pectoris: Secondary | ICD-10-CM

## 2018-01-30 DIAGNOSIS — Z808 Family history of malignant neoplasm of other organs or systems: Secondary | ICD-10-CM | POA: Insufficient documentation

## 2018-01-30 DIAGNOSIS — E119 Type 2 diabetes mellitus without complications: Secondary | ICD-10-CM

## 2018-01-30 DIAGNOSIS — Z79899 Other long term (current) drug therapy: Secondary | ICD-10-CM | POA: Diagnosis not present

## 2018-01-30 DIAGNOSIS — Z90710 Acquired absence of both cervix and uterus: Secondary | ICD-10-CM | POA: Insufficient documentation

## 2018-01-30 DIAGNOSIS — Z9011 Acquired absence of right breast and nipple: Secondary | ICD-10-CM | POA: Diagnosis not present

## 2018-01-30 DIAGNOSIS — Z7982 Long term (current) use of aspirin: Secondary | ICD-10-CM | POA: Diagnosis not present

## 2018-01-30 DIAGNOSIS — Z86718 Personal history of other venous thrombosis and embolism: Secondary | ICD-10-CM

## 2018-01-30 DIAGNOSIS — Z803 Family history of malignant neoplasm of breast: Secondary | ICD-10-CM

## 2018-01-30 DIAGNOSIS — Z800 Family history of malignant neoplasm of digestive organs: Secondary | ICD-10-CM | POA: Diagnosis not present

## 2018-01-30 DIAGNOSIS — Z853 Personal history of malignant neoplasm of breast: Secondary | ICD-10-CM | POA: Insufficient documentation

## 2018-01-30 DIAGNOSIS — Z794 Long term (current) use of insulin: Secondary | ICD-10-CM

## 2018-01-30 DIAGNOSIS — I4891 Unspecified atrial fibrillation: Secondary | ICD-10-CM | POA: Insufficient documentation

## 2018-01-30 DIAGNOSIS — M3500 Sicca syndrome, unspecified: Secondary | ICD-10-CM | POA: Diagnosis not present

## 2018-01-30 NOTE — Progress Notes (Signed)
Pt does not have pain from clot, but numbness and tingling in legs and feer from diabetes. Swelling in legs is not new but after she scraped her leg has not wore compression stocking. Sob on exertion

## 2018-01-30 NOTE — Progress Notes (Signed)
Hematology/Oncology Consult note [facility] [phone] [phone]  Patient Care Team: Hortencia Pilar, MD as PCP - General (Family Medicine)   Name of the patient: Molly Mercado  [id number]  1930/07/16    Reason for referral- h/o DVT   Referring physician- Dr. Hoy Morn  Date of visit: 01/30/18   History of presenting illness-patient is a 84 year old female with a past medical history significant for atrial fibrillation hypertension hyperlipidemia obesity among other medical problems.  She has a history of uterine cancer in the past and has undergone hysterectomy.  In September 2018 patient underwent a hip surgery.  Following that patient had atrial fibrillation and has been on Eliquis since then.  In January 2019 patient was diagnosed with ER positive breast cancer and is status post lumpectomy.  Sentinel lymph node biopsy was not done and patient did not receive any adjuvant radiation given her age.  There was also discussion about risks and benefits of endocrine therapy and patient opted not to go for endocrine therapy for her breast cancer.  Recently a few weeks ago patient was at the swimming pool and when she was trying to step out of the pool she had abrasion of her left lower extremity which resulted in the left lower extremity swelling.  This led to a lower extremity Doppler which did not reveal any DVT in her left extremity however it picked up an incidental DVT of the right common femoral vein.  Patient has had no prior history of DVT.  Her son had a DVT and a PE in his 70s and died later from an unknown heart condition.  She has had 3 prior pregnancies without any complications.  Currently she continues to take Eliquis for her A. fib.  She has been referred to [place] for further management options for her incidentally diagnosed right common femoral vein DVT  ECOG PS- 2  Pain scale- 0   Review of systems- Review of Systems    Constitutional: Positive for malaise/fatigue. Negative for chills, fever and weight loss.  HENT: Negative for congestion, ear discharge and nosebleeds.   Eyes: Negative for blurred vision.  Respiratory: Negative for cough, hemoptysis, sputum production, shortness of breath and wheezing.   Cardiovascular: Negative for chest pain, palpitations, orthopnea and claudication.  Gastrointestinal: Negative for abdominal pain, blood in stool, constipation, diarrhea, heartburn, melena, nausea and vomiting.  Genitourinary: Negative for dysuria, flank pain, frequency, hematuria and urgency.  Musculoskeletal: Negative for back pain, joint pain and myalgias.  Skin: Negative for rash.  Neurological: Negative for dizziness, tingling, focal weakness, seizures, weakness and headaches.  Endo/Heme/Allergies: Does not bruise/bleed easily.  Psychiatric/Behavioral: Negative for depression and suicidal ideas. The patient does not have insomnia.     Allergies  Allergen Reactions  . Acetaminophen Other (See Comments)    Liver Disorder  . Amlodipine Swelling  . Diltiazem Swelling  . Lisinopril Cough  . Penicillins Other (See Comments)  . Sulfa Antibiotics Other (See Comments)  . Hctz [Hydrochlorothiazide] Rash    There are no active problems to display for this patient.    Past Medical History:  Diagnosis Date  . Arthritis   . Atrial fibrillation ([place])   . Breast cancer ([place])    2019  . Coronary artery disease   . Diabetes mellitus without complication ([place])   . DVT (deep venous thrombosis) ([place])    right leg 01/2018  . Hypercholesteremia   . Hypertension   . Obesity   . Palpitation   .  Sicca syndrome ([place])   . Uterine cancer ([place])   . Uterine cancer [facility])    when she was 84 years old     Past Surgical History:  Procedure Laterality Date  . ABDOMINAL HYSTERECTOMY    . APPENDECTOMY    . CARDIAC SURGERY    . MASTECTOMY, RADICAL Right    2019  . TOTAL HIP ARTHROPLASTY Left 2007  . TOTAL  HIP ARTHROPLASTY Right    03/2017    Social History   Socioeconomic History  . Marital status: Married    Spouse name: Not on file  . Number of children: Not on file  . Years of education: Not on file  . Highest education level: Not on file  Occupational History  . Occupation: reitred Education officer, museum    Comment: [place]  . Financial resource strain: Not on file  . Food insecurity:    Worry: Not on file    Inability: Not on file  . Transportation needs:    Medical: Not on file    Non-medical: Not on file  Tobacco Use  . Smoking status: Never Smoker  . Smokeless tobacco: Never Used  Substance and Sexual Activity  . Alcohol use: No  . Drug use: No  . Sexual activity: Never  Lifestyle  . Physical activity:    Days per week: Not on file    Minutes per session: Not on file  . Stress: Not on file  Relationships  . Social connections:    Talks on phone: Not on file    Gets together: Not on file    Attends religious service: Not on file    Active member of club or organization: Not on file    Attends meetings of clubs or organizations: Not on file    Relationship status: Not on file  . Intimate partner violence:    Fear of current or ex partner: Not on file    Emotionally abused: Not on file    Physically abused: Not on file    Forced sexual activity: Not on file  Other Topics Concern  . Not on file  Social History Narrative  . Not on file     Family History  Problem Relation Age of Onset  . Breast cancer Mother   . Breast cancer Maternal Aunt   . Melanoma Paternal Uncle   . Cancer Maternal Grandmother   . Colon cancer Maternal Aunt   . Lung cancer Paternal Uncle      Current Outpatient Medications:  .  apixaban (ELIQUIS) 5 MG TABS tablet, Take 5 mg by mouth 2 (two) times daily., Disp: , Rfl:  .  atorvastatin (LIPITOR) 40 MG tablet, Take 40 mg by mouth daily., Disp: , Rfl:  .  carvedilol (COREG) 25 MG tablet, Take 25 mg by mouth 2 (two)  times daily with a meal., Disp: , Rfl:  .  Cholecalciferol (VITAMIN D-3) 1000 units CAPS, Take 1 capsule by mouth daily., Disp: , Rfl:  .  docusate sodium (COLACE) 100 MG capsule, Take 100 mg by mouth 2 (two) times daily., Disp: , Rfl:  .  furosemide (LASIX) 20 MG tablet, Take 20 mg by mouth daily., Disp: , Rfl:  .  ibuprofen (ADVIL,MOTRIN) 200 MG tablet, Take 400 mg by mouth every 6 (six) hours as needed for moderate pain., Disp: , Rfl:  .  insulin glargine (LANTUS) 100 UNIT/ML injection, Inject 48 Units into the skin at bedtime. Up to 48 units based  On sliding scale, Disp: , Rfl:  .  insulin lispro (HUMALOG) 100 UNIT/ML injection, Inject 15 Units into the skin 3 (three) times daily with meals., Disp: , Rfl:  .  losartan (COZAAR) 100 MG tablet, Take 100 mg by mouth daily., Disp: , Rfl:  .  Multiple Vitamin (MULTIVITAMIN) tablet, Take 1 tablet by mouth daily., Disp: , Rfl:  .  nitroGLYCERIN (NITROSTAT) 0.4 MG SL tablet, Place 0.4 mg under the tongue every 5 (five) minutes as needed for chest pain., Disp: , Rfl:  .  polyethylene glycol (MIRALAX / GLYCOLAX) packet, Take 17 g by mouth daily as needed., Disp: , Rfl:  .  spironolactone (ALDACTONE) 25 MG tablet, Take 25 mg by mouth daily., Disp: , Rfl:  .  vitamin B-12 (CYANOCOBALAMIN) 1000 MCG tablet, Take 1,000 mcg by mouth daily., Disp: , Rfl:  .  aspirin 81 MG tablet, Take 81 mg by mouth daily., Disp: , Rfl:  .  ketoconazole (NIZORAL) 2 % cream, Apply 1 application topically 2 (two) times daily., Disp: 60 g, Rfl: 1 .  meloxicam (MOBIC) 7.5 MG tablet, Take 7.5 mg by mouth daily., Disp: , Rfl:  .  nystatin (NYSTATIN) powder, Apply topically 2 (two) times daily., Disp: , Rfl:    Physical exam:  Vitals:   01/30/18 1357  BP: 116/72  Pulse: 62  Resp: 18  Temp: 99.8 F (37.7 C)  TempSrc: Tympanic  Weight: 211 lb 9.6 oz (96 kg)  Height: 5\' 2"  (1.575 m)   Physical Exam  Constitutional: She is oriented to person, place, and time.  Patient is  elderly and obese.  She ambulates with a walker  HENT:  Head: Normocephalic and atraumatic.  Eyes: Pupils are equal, round, and reactive to light. EOM are normal.  Neck: Normal range of motion.  Cardiovascular: Normal rate, regular rhythm and normal heart sounds.  Pulmonary/Chest: Effort normal and breath sounds normal.  Abdominal: Soft. Bowel sounds are normal.  Neurological: She is alert and oriented to person, place, and time.  Skin: Skin is warm and dry.  Skin over left lower extremity appears mildly erythematous and exfoliated from recent injury.  No overt appearance of cellulitis.  Her right lower extremity shows no redness or swelling.  Trace bilateral edema       CMP Latest Ref Rng & Units 03/18/2016  Glucose 65 - 99 mg/dL 71  BUN 6 - 20 mg/dL 18  Creatinine 0.44 - 1.00 mg/dL 0.93  Sodium 135 - 145 mmol/L 140  Potassium 3.5 - 5.1 mmol/L 3.4(L)  Chloride 101 - 111 mmol/L 106  CO2 22 - 32 mmol/L 26  Calcium 8.9 - 10.3 mg/dL 9.9  Total Protein 6.5 - 8.1 g/dL 6.9  Total Bilirubin 0.3 - 1.2 mg/dL 0.7  Alkaline Phos 38 - 126 U/L 58  AST 15 - 41 U/L 21  ALT 14 - 54 U/L 19   CBC Latest Ref Rng & Units 03/18/2016  WBC 3.6 - 11.0 K/uL 7.9  Hemoglobin 12.0 - 16.0 g/dL 13.1  Hematocrit 35.0 - 47.0 % 37.5  Platelets 150 - 440 K/uL 217    No images are attached to the encounter.  Dg Tibia/fibula Left  Result Date: 01/06/2018 CLINICAL DATA:  Fall.  Pain swelling. EXAM: LEFT TIBIA AND FIBULA - 2 VIEW COMPARISON:  No prior. FINDINGS: Diffuse soft tissue swelling. No acute soft tissue bony abnormality identified. No evidence of fracture or dislocation. Degenerative changes noted about the left knee. IMPRESSION: Diffuse soft tissue swelling. No acute  bony abnormality identified. Degenerative changes left knee. Electronically Signed   By: Marcello Moores  Register   On: 01/06/2018 14:40   Dg Ankle 2 Views Left  Result Date: 01/06/2018 CLINICAL DATA:  Fall 10 days ago.  Pain and swelling. EXAM:  LEFT ANKLE - 2 VIEW COMPARISON:  No recent. FINDINGS: Diffuse soft tissue swelling. A subtle fracture along the anterior aspect of the distal tibia cannot be completely excluded. Medial and lateral malleoli are intact. Calcaneal spurring noted. IMPRESSION: Diffuse soft tissue swelling. Subtle fracture along the anterior aspect of the distal tibia cannot be excluded. Electronically Signed   By: Marcello Moores  Register   On: 01/06/2018 14:37   US Venous Img Lower Unilateral Left  Result Date: 01/06/2018 CLINICAL DATA:  Left leg pain EXAM: LEFT LOWER EXTREMITY VENOUS DUPLEX ULTRASOUND TECHNIQUE: Doppler venous assessment of the left lower extremity deep venous system was performed, including characterization of spectral flow, compressibility, and phasicity. COMPARISON:  None. FINDINGS: There is complete compressibility of the left common femoral, femoral, and popliteal veins. Doppler analysis demonstrates respiratory phasicity and augmentation of flow with calf compression. No obvious superficial vein or calf vein thrombosis. Limited imaging in the right lower extremity was performed in the right common femoral vein. The right common femoral vein is partially compressible and contains partially occlusive thrombus. IMPRESSION: The study is positive for DVT in the right common femoral vein. There is partially occlusive thrombus in the right common femoral vein. There is no evidence of DVT in the left lower extremity. Please note that the study was performed in the evaluation of the left lower extremity but images of the right common femoral vein were included. Electronically Signed   By: Marybelle Killings M.D.   On: 01/06/2018 09:49    Assessment and plan- Patient is a 84 y.o. female who has been referred for incidentally found right common femoral vein DVT  Patient does not have any prior history of DVT and is already on Eliquis for atrial fibrillation.  Patient underwent Doppler of her left lower extremity following her  trauma which did not reveal a DVT in the left leg but incidentally picked up a DVT in her right common femoral vein.  It is unclear how long she has had this DVT and if this is acute or chronic.  Given patient's age and comorbidities and the fact that she is already on Eliquis, I would not change her anticoagulation at this time.  I also do not think that a hypercoagulable work-up will change her management.  I do not recommend getting a repeat ultrasound to follow this DVT unless she has any new symptoms such as worsening right lower extremity swelling redness or pain.  Patient is appropriately anticoagulated for her atrial fibrillation which will also be therapeutic for her incidental DVT.  It is possible that her DVT is secondary to her obesity and relative lack of mobility given her age.    Patient does not require hematology follow-up at this time and can continue to follow-up with her primary care doctor.  Should she have any new symptoms of DVT and has new recurrent DVT on Eliquis she can be sent to [place] for further evaluation   Thank you for this kind referral and the opportunity to participate in the care of this patient   Visit Diagnosis 1. History of DVT (deep vein thrombosis)     Dr. Randa Evens, MD, MPH [facility] at [facility] [phone] 01/30/2018 8:31 AM

## 2018-01-31 ENCOUNTER — Encounter: Payer: Self-pay | Admitting: Oncology

## 2018-07-03 IMAGING — CR DG TIBIA/FIBULA 2V*L*
1 series · 4 of 4 positions shown · non-contrast
Comparison: No prior.

CLINICAL DATA: Fall.  Pain swelling.

EXAM:
LEFT TIBIA AND FIBULA - 2 VIEW

[Series 1: dg tibia/fibula left · 0.14mm/px · 4 of 4 slices shown]
[im 1/4]
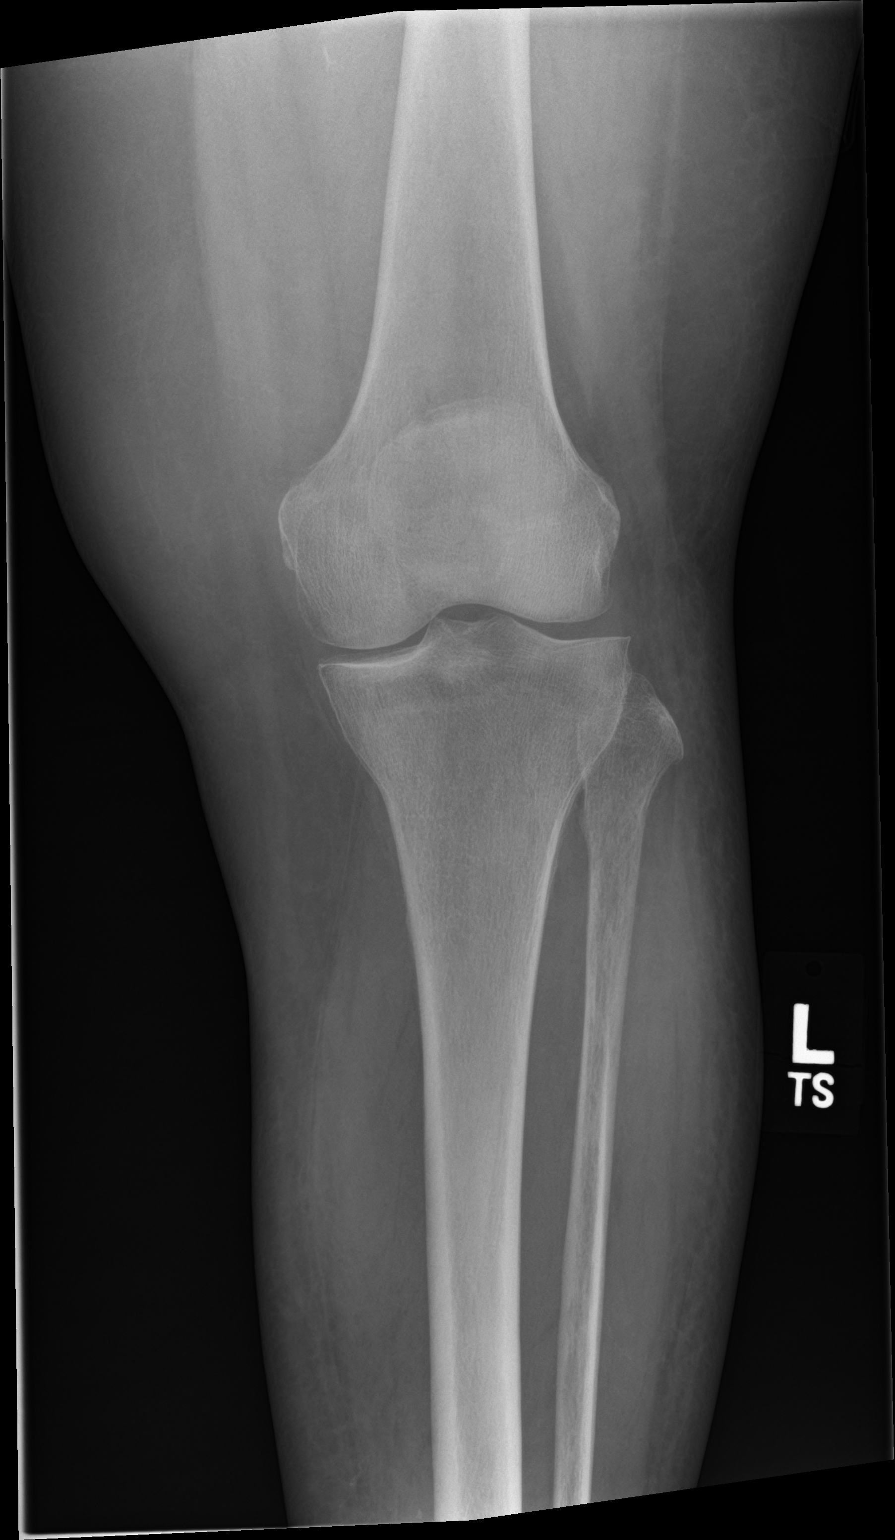
[im 2/4]
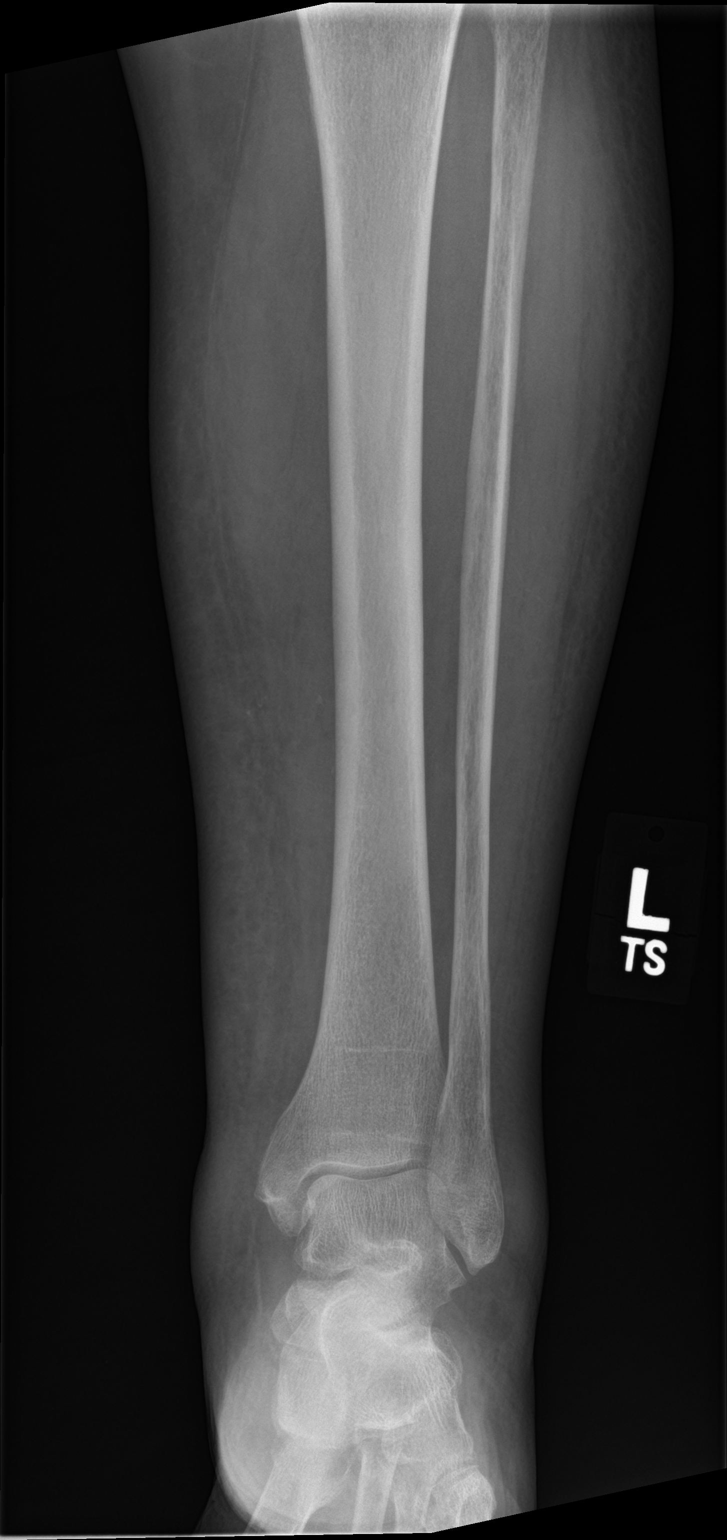
[im 3/4]
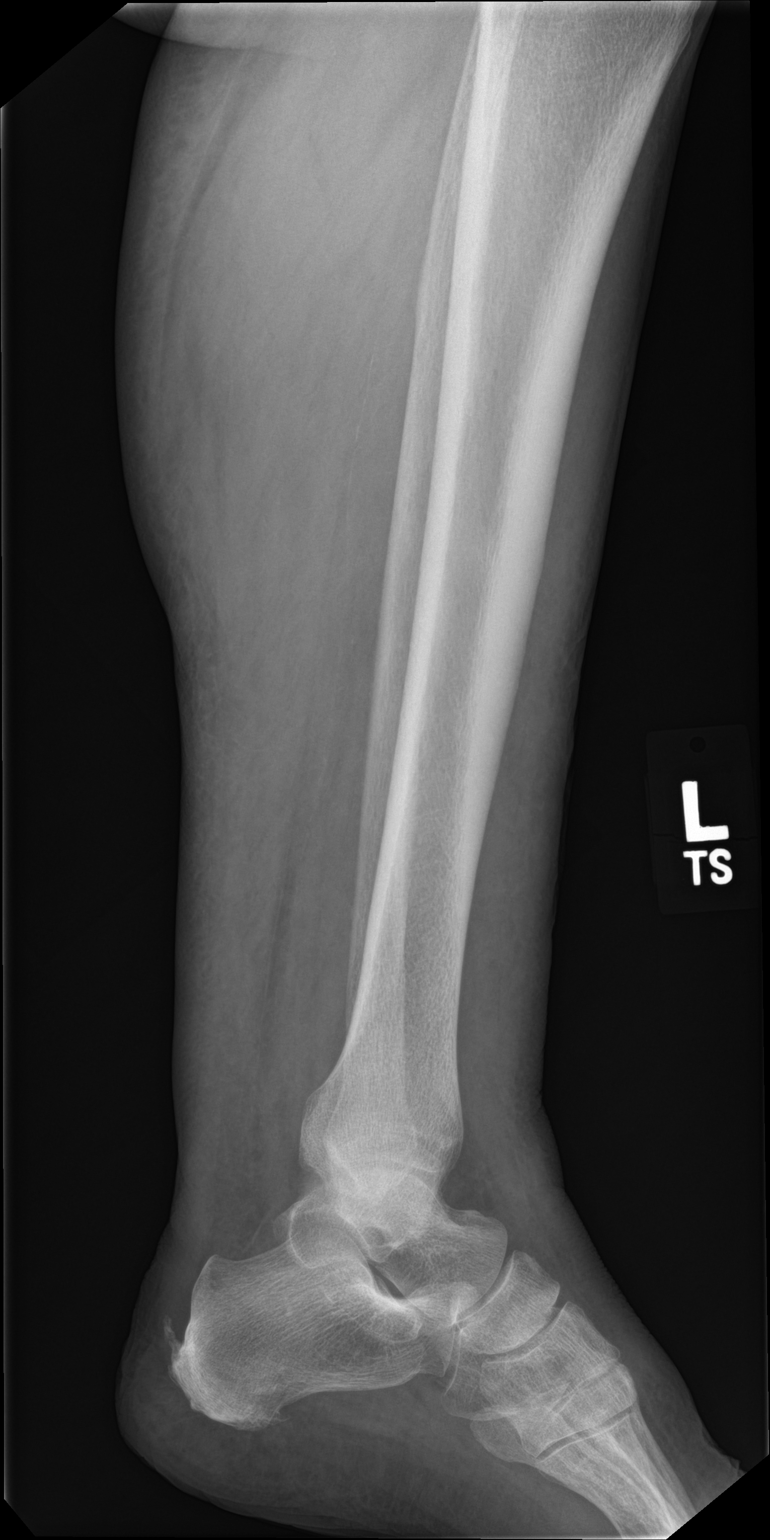
[im 4/4]
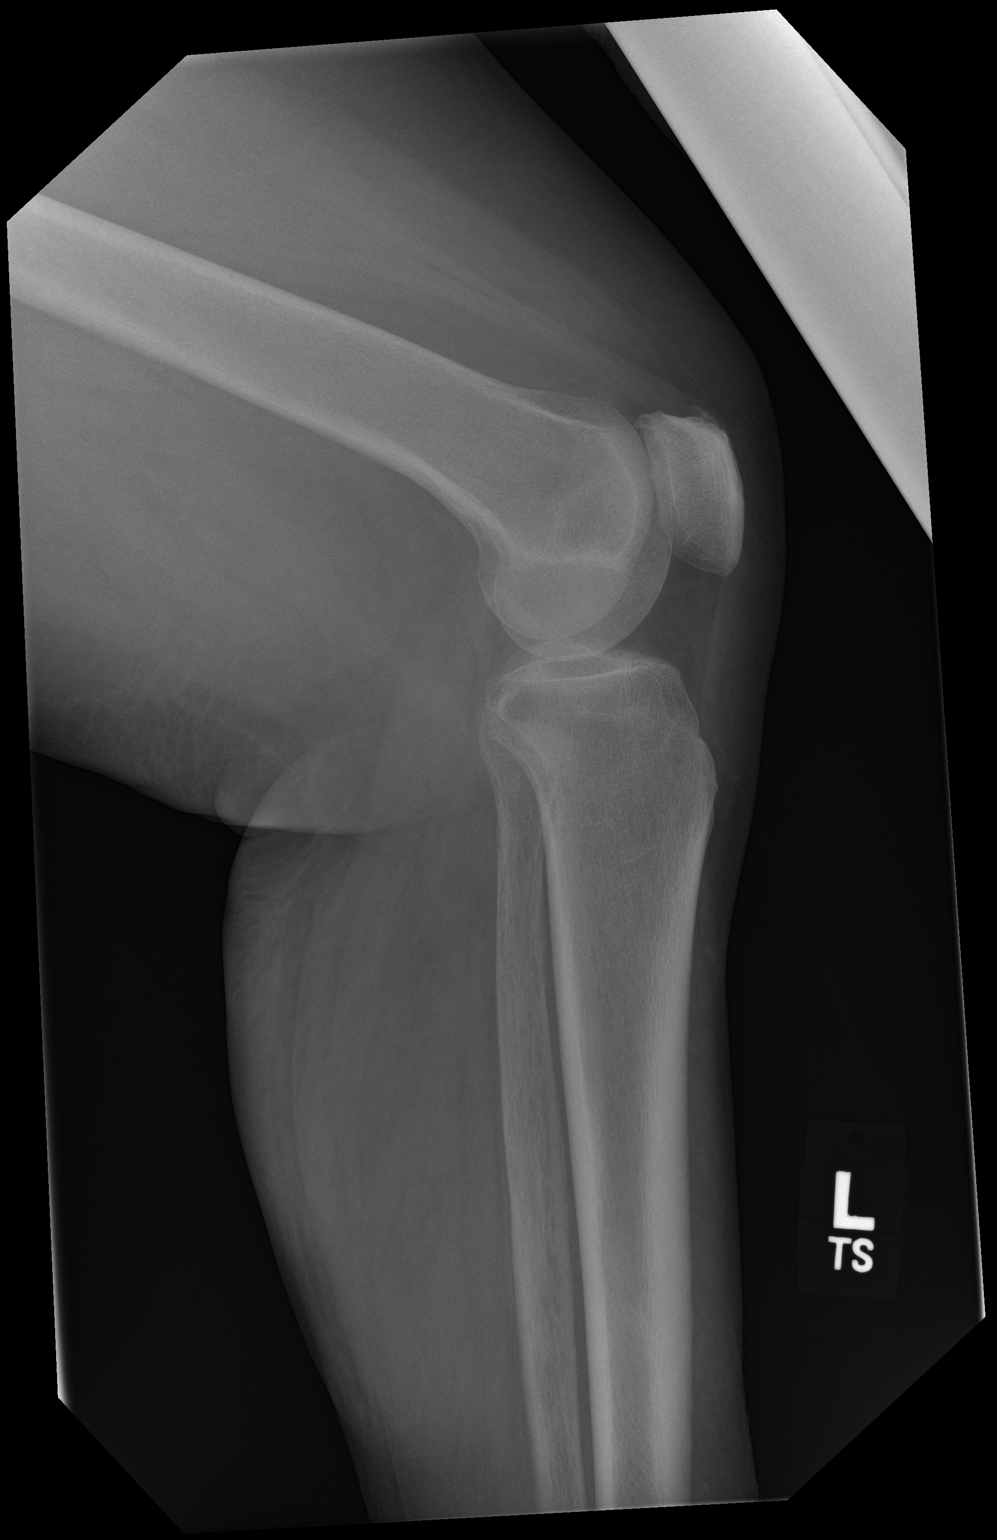

[4 of 4 positions shown; findings below may reference images not displayed]

FINDINGS: Diffuse soft tissue swelling. No acute soft tissue bony abnormality
identified. No evidence of fracture or dislocation. Degenerative
changes noted about the left knee.
IMPRESSION: Diffuse soft tissue swelling. No acute bony abnormality identified.
Degenerative changes left knee.

## 2018-07-03 IMAGING — CR DG ANKLE 2V *L*
1 series · 2 of 2 positions shown · non-contrast
Comparison: No recent.

CLINICAL DATA: Fall 10 days ago.  Pain and swelling.

EXAM:
LEFT ANKLE - 2 VIEW

[Series 1: dg ankle 2 views left · 0.14mm/px · 2 of 2 slices shown]
[im 1/2]
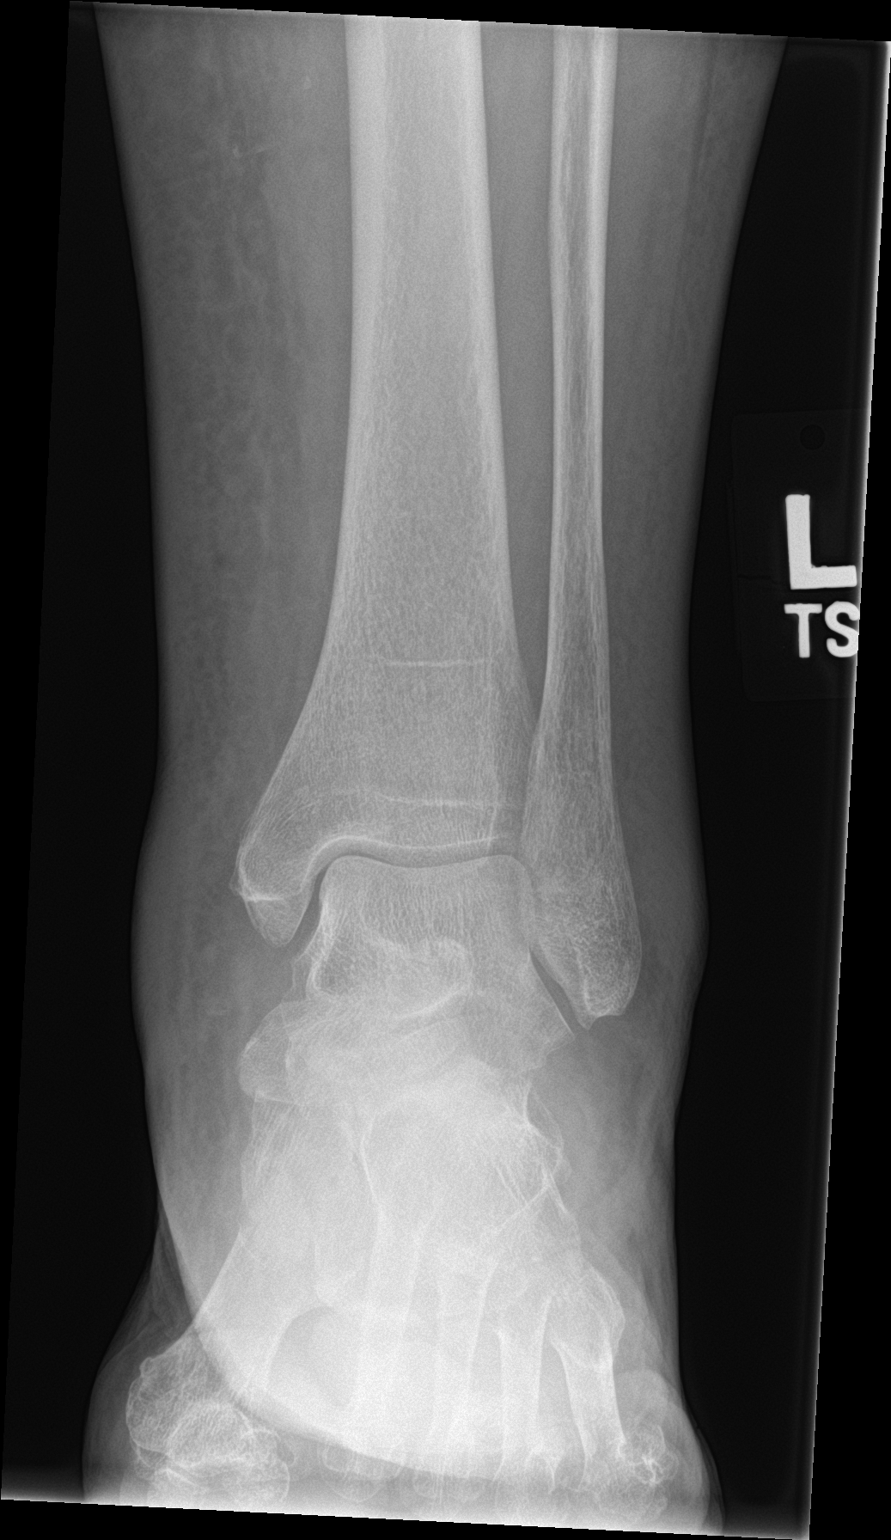
[im 2/2]
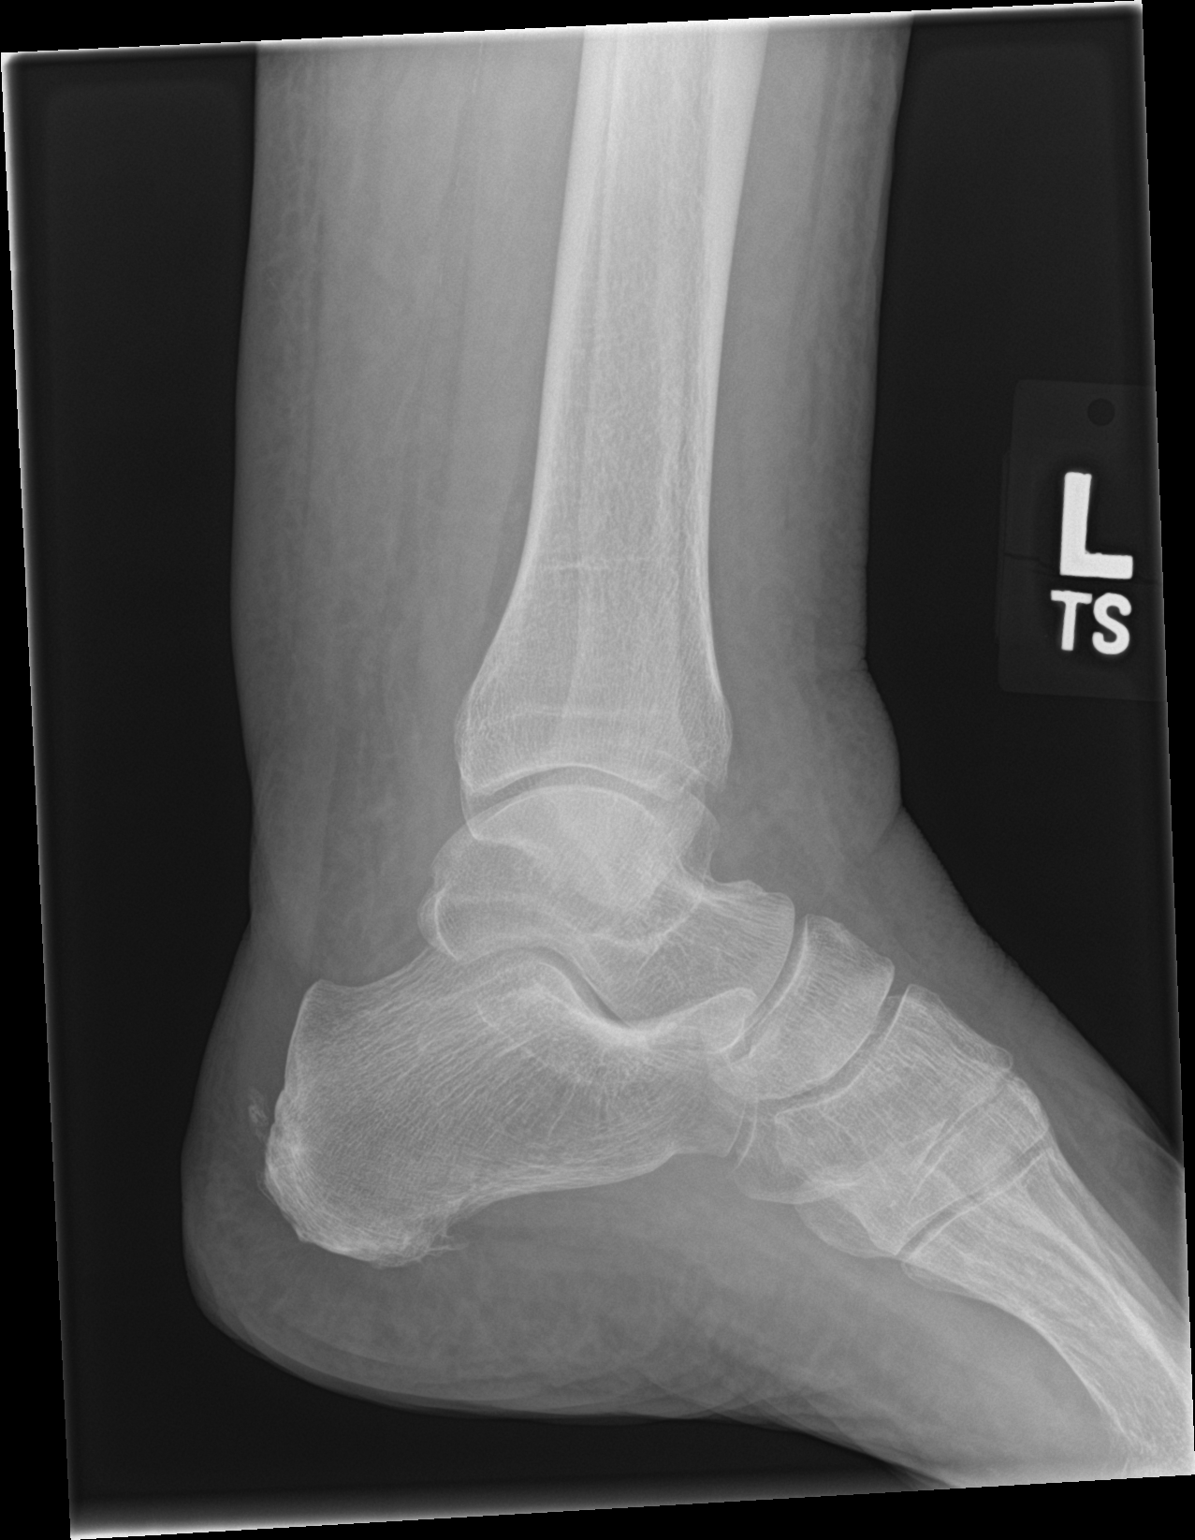

[2 of 2 positions shown; findings below may reference images not displayed]

FINDINGS: Diffuse soft tissue swelling. A subtle fracture along the anterior
aspect of the distal tibia cannot be completely excluded. Medial and
lateral malleoli are intact. Calcaneal spurring noted.
IMPRESSION: Diffuse soft tissue swelling. Subtle fracture along the anterior
aspect of the distal tibia cannot be excluded.

## 2020-04-24 IMAGING — US US EXTREM LOW VENOUS*L*
1 series · 14 of 24 positions shown · non-contrast
Comparison: None.

CLINICAL DATA: Left leg pain

EXAM:
LEFT LOWER EXTREMITY VENOUS DUPLEX ULTRASOUND
TECHNIQUE: Doppler venous assessment of the left lower extremity deep venous
system was performed, including characterization of spectral flow,
compressibility, and phasicity.

[Series 1: us extrem low venous*left* · 0.08mm/px · 14 of 33 slices shown]
[im 1/33]
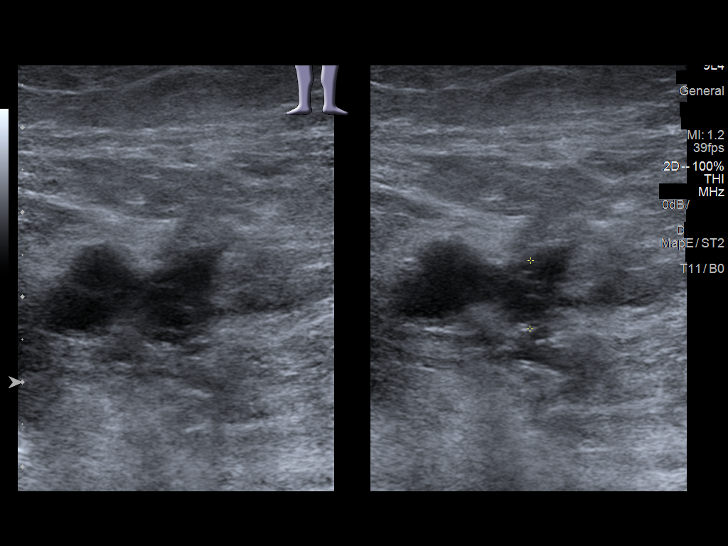
[im 3/33]
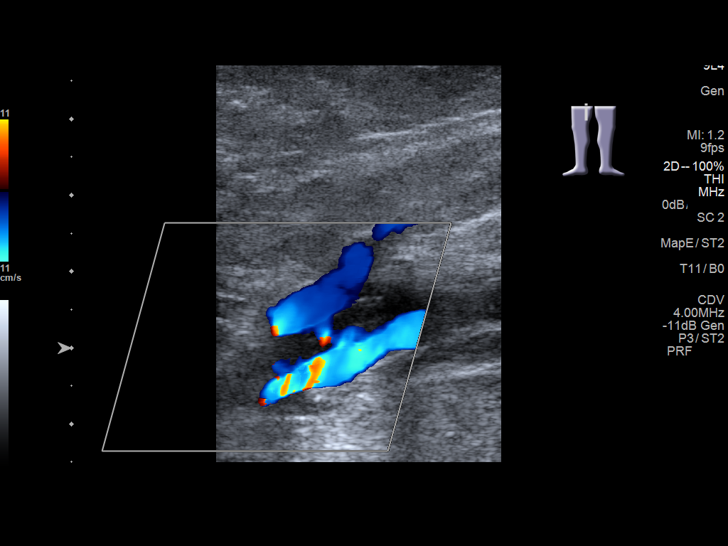
[im 6/33]
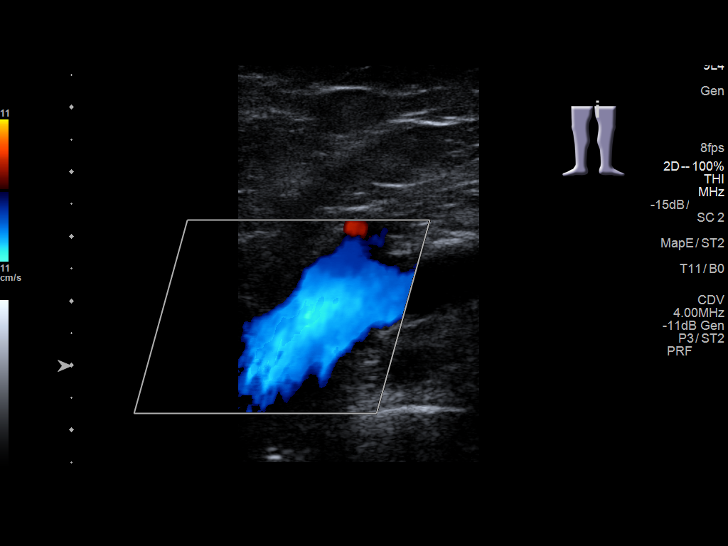
[im 9/33]
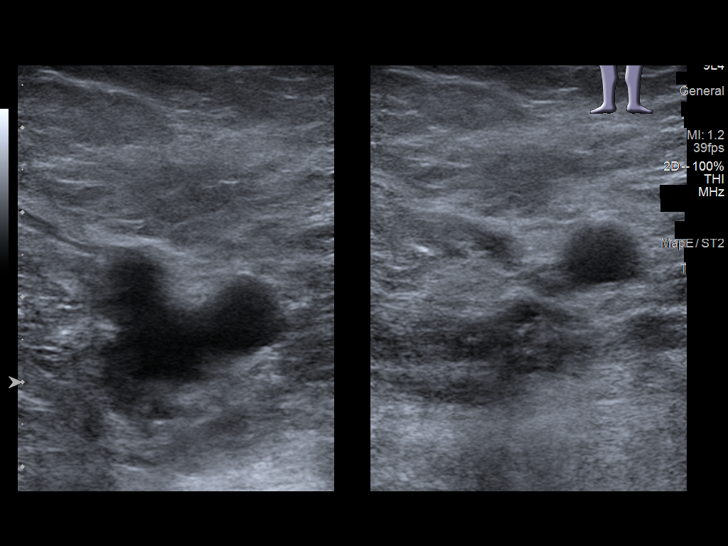
[im 10/33]
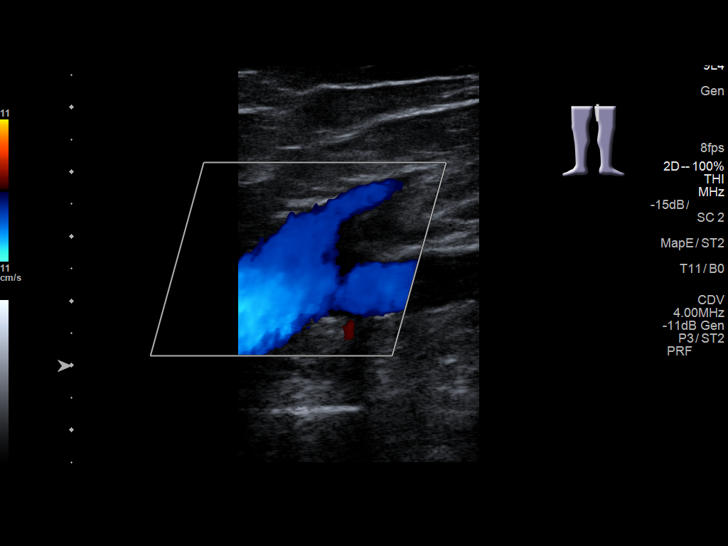
[im 13/33]
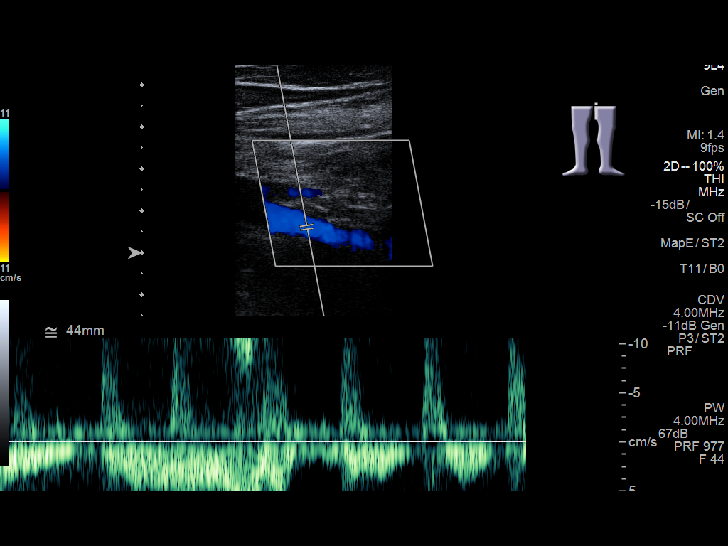
[im 16/33]
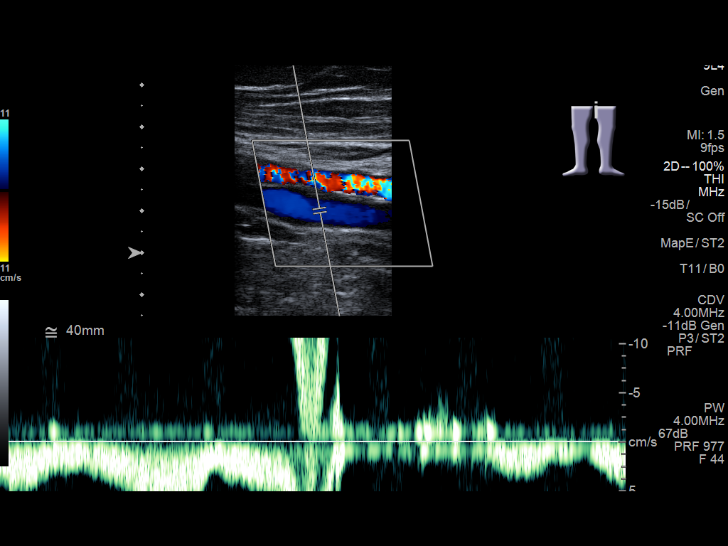
[im 17/33]
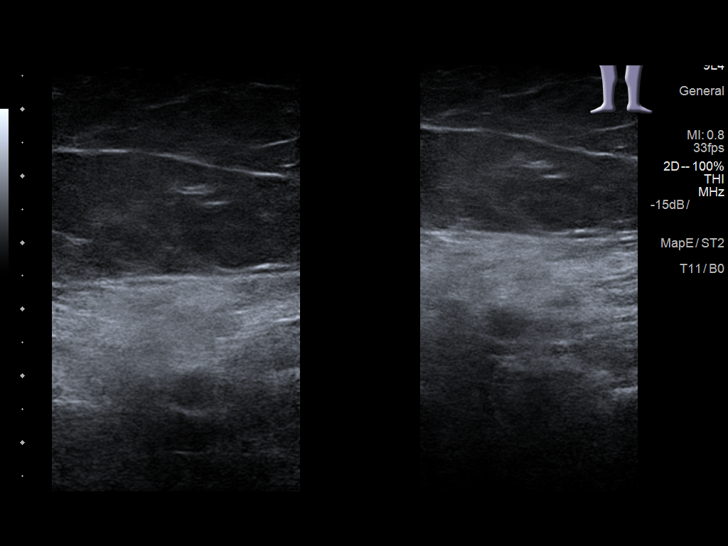
[im 20/33]
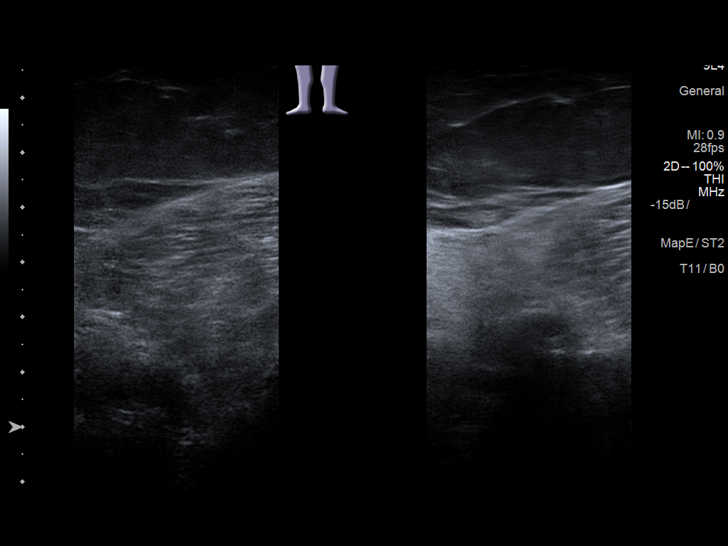
[im 23/33]
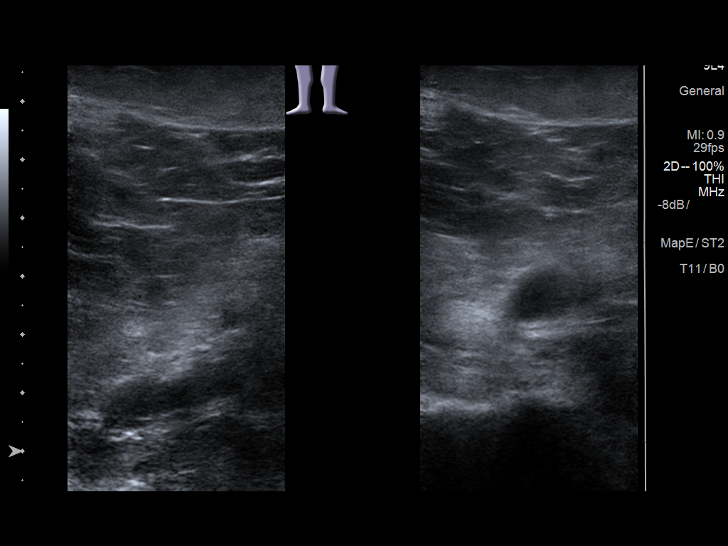
[im 26/33]
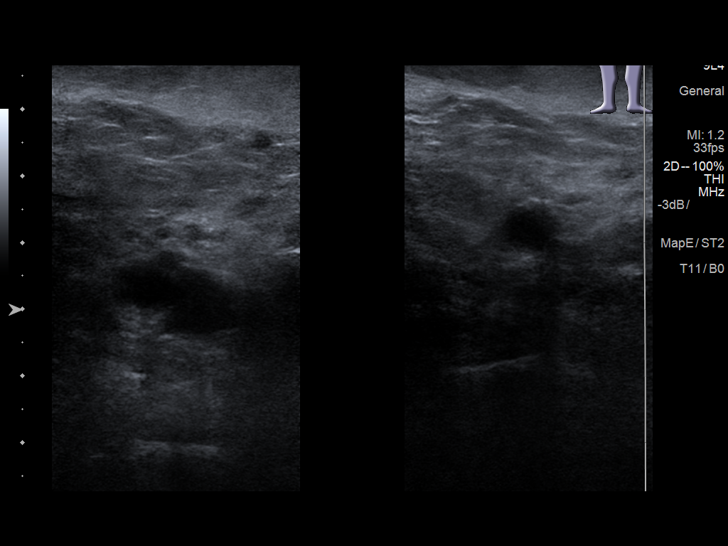
[im 27/33]
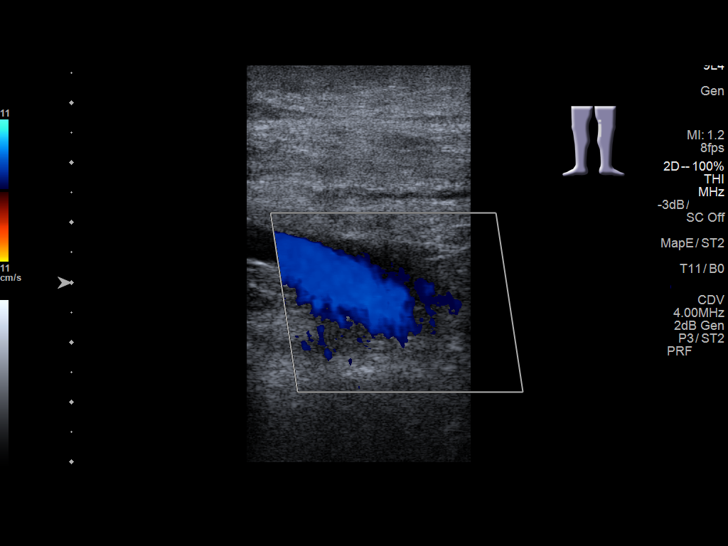
[im 30/33]
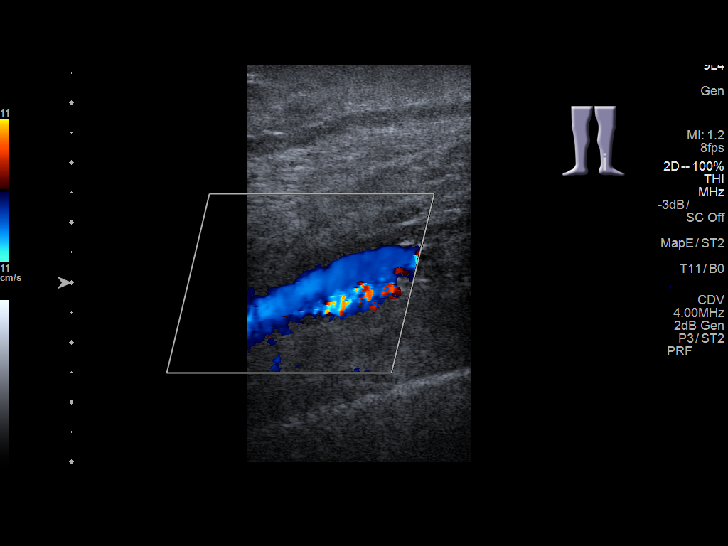
[im 33/33]
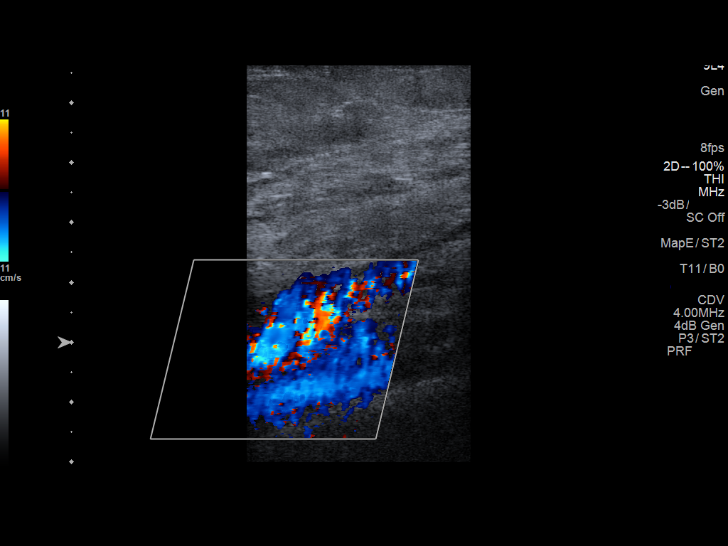

[14 of 24 positions shown; findings below may reference images not displayed]

FINDINGS: There is complete compressibility of the left common femoral,
femoral, and popliteal veins. Doppler analysis demonstrates
respiratory phasicity and augmentation of flow with calf
compression. No obvious superficial vein or calf vein thrombosis.

Limited imaging in the right lower extremity was performed in the
right common femoral vein. The right common femoral vein is
partially compressible and contains partially occlusive thrombus.
IMPRESSION: The study is positive for DVT in the right common femoral vein.
There is partially occlusive thrombus in the right common femoral
vein. There is no evidence of DVT in the left lower extremity.
Please note that the study was performed in the evaluation of the
left lower extremity but images of the right common femoral vein
were included.

## 2023-11-30 DIAGNOSIS — I5030 Unspecified diastolic (congestive) heart failure: Secondary | ICD-10-CM | POA: Diagnosis not present

## 2023-11-30 DIAGNOSIS — E1122 Type 2 diabetes mellitus with diabetic chronic kidney disease: Secondary | ICD-10-CM | POA: Diagnosis not present

## 2023-11-30 DIAGNOSIS — Z1331 Encounter for screening for depression: Secondary | ICD-10-CM | POA: Diagnosis not present

## 2023-11-30 DIAGNOSIS — N189 Chronic kidney disease, unspecified: Secondary | ICD-10-CM | POA: Diagnosis not present

## 2023-11-30 DIAGNOSIS — I130 Hypertensive heart and chronic kidney disease with heart failure and stage 1 through stage 4 chronic kidney disease, or unspecified chronic kidney disease: Secondary | ICD-10-CM | POA: Diagnosis not present

## 2023-11-30 DIAGNOSIS — G3184 Mild cognitive impairment, so stated: Secondary | ICD-10-CM | POA: Diagnosis not present

## 2023-11-30 DIAGNOSIS — E785 Hyperlipidemia, unspecified: Secondary | ICD-10-CM | POA: Diagnosis not present

## 2023-11-30 DIAGNOSIS — I4891 Unspecified atrial fibrillation: Secondary | ICD-10-CM | POA: Diagnosis not present

## 2023-11-30 DIAGNOSIS — Z79899 Other long term (current) drug therapy: Secondary | ICD-10-CM | POA: Diagnosis not present

## 2023-12-19 DIAGNOSIS — I5032 Chronic diastolic (congestive) heart failure: Secondary | ICD-10-CM | POA: Diagnosis not present

## 2023-12-19 DIAGNOSIS — I480 Paroxysmal atrial fibrillation: Secondary | ICD-10-CM | POA: Diagnosis not present

## 2023-12-19 DIAGNOSIS — I2585 Chronic coronary microvascular dysfunction: Secondary | ICD-10-CM | POA: Diagnosis not present

## 2023-12-19 DIAGNOSIS — I110 Hypertensive heart disease with heart failure: Secondary | ICD-10-CM | POA: Diagnosis not present

## 2023-12-19 DIAGNOSIS — E7800 Pure hypercholesterolemia, unspecified: Secondary | ICD-10-CM | POA: Diagnosis not present

## 2024-02-01 DIAGNOSIS — I10 Essential (primary) hypertension: Secondary | ICD-10-CM | POA: Diagnosis not present

## 2024-02-01 DIAGNOSIS — R002 Palpitations: Secondary | ICD-10-CM | POA: Diagnosis not present

## 2024-02-01 DIAGNOSIS — Z794 Long term (current) use of insulin: Secondary | ICD-10-CM | POA: Diagnosis not present

## 2024-02-01 DIAGNOSIS — E1122 Type 2 diabetes mellitus with diabetic chronic kidney disease: Secondary | ICD-10-CM | POA: Diagnosis not present

## 2024-02-01 DIAGNOSIS — N184 Chronic kidney disease, stage 4 (severe): Secondary | ICD-10-CM | POA: Diagnosis not present

## 2024-02-03 DIAGNOSIS — H903 Sensorineural hearing loss, bilateral: Secondary | ICD-10-CM | POA: Diagnosis not present

## 2024-03-05 DIAGNOSIS — G4700 Insomnia, unspecified: Secondary | ICD-10-CM | POA: Diagnosis not present

## 2024-03-05 DIAGNOSIS — G3184 Mild cognitive impairment, so stated: Secondary | ICD-10-CM | POA: Diagnosis not present

## 2024-03-05 DIAGNOSIS — Z1331 Encounter for screening for depression: Secondary | ICD-10-CM | POA: Diagnosis not present

## 2024-03-28 DIAGNOSIS — I5030 Unspecified diastolic (congestive) heart failure: Secondary | ICD-10-CM | POA: Diagnosis not present

## 2024-03-28 DIAGNOSIS — Z23 Encounter for immunization: Secondary | ICD-10-CM | POA: Diagnosis not present

## 2024-03-28 DIAGNOSIS — G479 Sleep disorder, unspecified: Secondary | ICD-10-CM | POA: Diagnosis not present

## 2024-03-28 DIAGNOSIS — Z79899 Other long term (current) drug therapy: Secondary | ICD-10-CM | POA: Diagnosis not present

## 2024-03-28 DIAGNOSIS — I4891 Unspecified atrial fibrillation: Secondary | ICD-10-CM | POA: Diagnosis not present

## 2024-03-28 DIAGNOSIS — Z0000 Encounter for general adult medical examination without abnormal findings: Secondary | ICD-10-CM | POA: Diagnosis not present

## 2024-03-28 DIAGNOSIS — E1122 Type 2 diabetes mellitus with diabetic chronic kidney disease: Secondary | ICD-10-CM | POA: Diagnosis not present

## 2024-03-28 DIAGNOSIS — G3184 Mild cognitive impairment, so stated: Secondary | ICD-10-CM | POA: Diagnosis not present

## 2024-03-28 DIAGNOSIS — I130 Hypertensive heart and chronic kidney disease with heart failure and stage 1 through stage 4 chronic kidney disease, or unspecified chronic kidney disease: Secondary | ICD-10-CM | POA: Diagnosis not present

## 2024-05-11 ENCOUNTER — Other Ambulatory Visit: Payer: Self-pay

## 2024-05-11 ENCOUNTER — Emergency Department
Admission: EM | Admit: 2024-05-11 | Discharge: 2024-05-11 | Discharge status: Against Medical Advice | Attending: Emergency Medicine | Admitting: Emergency Medicine

## 2024-05-11 ENCOUNTER — Emergency Department

## 2024-05-11 DIAGNOSIS — Z5321 Procedure and treatment not carried out due to patient leaving prior to being seen by health care provider: Secondary | ICD-10-CM | POA: Diagnosis not present

## 2024-05-11 DIAGNOSIS — I700 Atherosclerosis of aorta: Secondary | ICD-10-CM | POA: Diagnosis not present

## 2024-05-11 DIAGNOSIS — R002 Palpitations: Secondary | ICD-10-CM | POA: Insufficient documentation

## 2024-05-11 DIAGNOSIS — R918 Other nonspecific abnormal finding of lung field: Secondary | ICD-10-CM | POA: Diagnosis not present

## 2024-05-11 LAB — CBC
HCT: 38.5 % (ref 36.0–46.0)
Hemoglobin: 12.5 g/dL (ref 12.0–15.0)
MCH: 28.5 pg (ref 26.0–34.0)
MCHC: 32.5 g/dL (ref 30.0–36.0)
MCV: 87.7 fL (ref 80.0–100.0)
Platelets: 207 K/uL (ref 150–400)
RBC: 4.39 MIL/uL (ref 3.87–5.11)
RDW: 13.1 % (ref 11.5–15.5)
WBC: 7.5 K/uL (ref 4.0–10.5)
nRBC: 0 % (ref 0.0–0.2)

## 2024-05-11 LAB — BASIC METABOLIC PANEL WITH GFR
Anion gap: 9 (ref 5–15)
BUN: 30 mg/dL — ABNORMAL HIGH (ref 8–23)
CO2: 25 mmol/L (ref 22–32)
Calcium: 8.7 mg/dL — ABNORMAL LOW (ref 8.9–10.3)
Chloride: 100 mmol/L (ref 98–111)
Creatinine, Ser: 1.78 mg/dL — ABNORMAL HIGH (ref 0.44–1.00)
GFR, Estimated: 27 mL/min — ABNORMAL LOW (ref >60)
Glucose, Bld: 231 mg/dL — ABNORMAL HIGH (ref 70–99)
Potassium: 4.7 mmol/L (ref 3.5–5.1)
Sodium: 134 mmol/L — ABNORMAL LOW (ref 135–145)

## 2024-05-11 LAB — TROPONIN I (HIGH SENSITIVITY): Troponin I (High Sensitivity): 10 ng/L (ref <18)

## 2024-05-11 LAB — CBG MONITORING, ED: Glucose-Capillary: 170 mg/dL — ABNORMAL HIGH (ref 70–99)

## 2024-05-11 NOTE — ED Triage Notes (Signed)
 Patient states she woke up this morning and heart didn't feel like it was beating normal; checked BP at home and it was 188/93 and HR 77. Patient denies chest pain and shortness of breath.

## 2024-05-11 NOTE — ED Notes (Signed)
 Patient to first nurse desk asking for blood sugar to be checked due to it being time for her to eat. Blood sugar checked by Eleanor, RN- [id number]. PT informed of blood sugar. PT stating she no longer wanted to stay. PT encouraged to stay. PT stating my diabetes is more important. Pt educated that pt's blood sugar was not low. PT refusing to stay and left ED with family. NAD noted.

## 2024-06-07 DIAGNOSIS — R410 Disorientation, unspecified: Secondary | ICD-10-CM | POA: Diagnosis not present

## 2024-06-07 DIAGNOSIS — M25571 Pain in right ankle and joints of right foot: Secondary | ICD-10-CM | POA: Diagnosis not present

## 2024-06-07 DIAGNOSIS — N1832 Chronic kidney disease, stage 3b: Secondary | ICD-10-CM | POA: Diagnosis not present

## 2024-06-07 DIAGNOSIS — R609 Edema, unspecified: Secondary | ICD-10-CM | POA: Diagnosis not present

## 2024-06-07 DIAGNOSIS — Y929 Unspecified place or not applicable: Secondary | ICD-10-CM | POA: Diagnosis not present

## 2024-06-07 DIAGNOSIS — I129 Hypertensive chronic kidney disease with stage 1 through stage 4 chronic kidney disease, or unspecified chronic kidney disease: Secondary | ICD-10-CM | POA: Diagnosis not present

## 2024-06-07 DIAGNOSIS — Z5181 Encounter for therapeutic drug level monitoring: Secondary | ICD-10-CM | POA: Diagnosis not present

## 2024-06-07 DIAGNOSIS — Z79899 Other long term (current) drug therapy: Secondary | ICD-10-CM | POA: Diagnosis not present

## 2024-06-07 DIAGNOSIS — E1122 Type 2 diabetes mellitus with diabetic chronic kidney disease: Secondary | ICD-10-CM | POA: Diagnosis not present

## 2024-06-07 DIAGNOSIS — W108XXA Fall (on) (from) other stairs and steps, initial encounter: Secondary | ICD-10-CM | POA: Diagnosis not present

## 2024-06-07 DIAGNOSIS — S9001XA Contusion of right ankle, initial encounter: Secondary | ICD-10-CM | POA: Diagnosis not present

## 2024-06-07 DIAGNOSIS — Z6720 Type B blood, Rh positive: Secondary | ICD-10-CM | POA: Diagnosis not present

## 2024-06-07 DIAGNOSIS — Z7901 Long term (current) use of anticoagulants: Secondary | ICD-10-CM | POA: Diagnosis not present

## 2024-06-07 DIAGNOSIS — R10812 Left upper quadrant abdominal tenderness: Secondary | ICD-10-CM | POA: Diagnosis not present

## 2024-06-07 DIAGNOSIS — S99911A Unspecified injury of right ankle, initial encounter: Secondary | ICD-10-CM | POA: Diagnosis not present
